# Patient Record
Sex: Male | Born: 1950 | Hispanic: No | Marital: Married | Smoking: Never smoker
Health system: Southern US, Community
[De-identification: ages and names within clinical notes are randomized; demographics above are authoritative.]

## PROBLEM LIST (undated history)

## (undated) DIAGNOSIS — M1710 Unilateral primary osteoarthritis, unspecified knee: Secondary | ICD-10-CM

## (undated) DIAGNOSIS — M179 Osteoarthritis of knee, unspecified: Secondary | ICD-10-CM

## (undated) DIAGNOSIS — I639 Cerebral infarction, unspecified: Secondary | ICD-10-CM

## (undated) DIAGNOSIS — S4991XA Unspecified injury of right shoulder and upper arm, initial encounter: Secondary | ICD-10-CM

## (undated) DIAGNOSIS — I10 Essential (primary) hypertension: Secondary | ICD-10-CM

## (undated) DIAGNOSIS — M7989 Other specified soft tissue disorders: Secondary | ICD-10-CM

## (undated) HISTORY — DX: Cerebral infarction, unspecified: I63.9

## (undated) HISTORY — PX: TONSILLECTOMY: SUR1361

---

## 2013-12-04 DIAGNOSIS — S4991XA Unspecified injury of right shoulder and upper arm, initial encounter: Secondary | ICD-10-CM

## 2013-12-04 HISTORY — DX: Unspecified injury of right shoulder and upper arm, initial encounter: S49.91XA

## 2016-08-30 DIAGNOSIS — Z1389 Encounter for screening for other disorder: Secondary | ICD-10-CM | POA: Diagnosis not present

## 2016-08-30 DIAGNOSIS — Z1211 Encounter for screening for malignant neoplasm of colon: Secondary | ICD-10-CM | POA: Diagnosis not present

## 2016-08-30 DIAGNOSIS — Z0000 Encounter for general adult medical examination without abnormal findings: Secondary | ICD-10-CM | POA: Diagnosis not present

## 2016-08-30 DIAGNOSIS — M170 Bilateral primary osteoarthritis of knee: Secondary | ICD-10-CM | POA: Diagnosis not present

## 2016-08-30 DIAGNOSIS — Z9181 History of falling: Secondary | ICD-10-CM | POA: Diagnosis not present

## 2016-08-30 DIAGNOSIS — I10 Essential (primary) hypertension: Secondary | ICD-10-CM | POA: Diagnosis not present

## 2016-08-30 DIAGNOSIS — Z125 Encounter for screening for malignant neoplasm of prostate: Secondary | ICD-10-CM | POA: Diagnosis not present

## 2016-08-30 DIAGNOSIS — Z23 Encounter for immunization: Secondary | ICD-10-CM | POA: Diagnosis not present

## 2016-09-20 DIAGNOSIS — Z1211 Encounter for screening for malignant neoplasm of colon: Secondary | ICD-10-CM | POA: Diagnosis not present

## 2016-09-20 DIAGNOSIS — K5730 Diverticulosis of large intestine without perforation or abscess without bleeding: Secondary | ICD-10-CM | POA: Diagnosis not present

## 2016-09-28 DIAGNOSIS — G8929 Other chronic pain: Secondary | ICD-10-CM | POA: Diagnosis not present

## 2016-09-28 DIAGNOSIS — M769 Unspecified enthesopathy, lower limb, excluding foot: Secondary | ICD-10-CM | POA: Diagnosis not present

## 2016-09-28 DIAGNOSIS — M25561 Pain in right knee: Secondary | ICD-10-CM | POA: Diagnosis not present

## 2016-09-28 DIAGNOSIS — M170 Bilateral primary osteoarthritis of knee: Secondary | ICD-10-CM | POA: Diagnosis not present

## 2016-09-28 DIAGNOSIS — M25562 Pain in left knee: Secondary | ICD-10-CM | POA: Diagnosis not present

## 2016-10-05 DIAGNOSIS — I10 Essential (primary) hypertension: Secondary | ICD-10-CM | POA: Diagnosis not present

## 2016-10-05 DIAGNOSIS — R7301 Impaired fasting glucose: Secondary | ICD-10-CM | POA: Diagnosis not present

## 2016-11-14 DIAGNOSIS — R0602 Shortness of breath: Secondary | ICD-10-CM | POA: Diagnosis not present

## 2016-11-14 DIAGNOSIS — Z6838 Body mass index (BMI) 38.0-38.9, adult: Secondary | ICD-10-CM | POA: Diagnosis not present

## 2016-11-14 DIAGNOSIS — G464 Cerebellar stroke syndrome: Secondary | ICD-10-CM | POA: Diagnosis not present

## 2016-11-14 DIAGNOSIS — R471 Dysarthria and anarthria: Secondary | ICD-10-CM | POA: Diagnosis not present

## 2016-11-14 DIAGNOSIS — R7303 Prediabetes: Secondary | ICD-10-CM | POA: Diagnosis not present

## 2016-11-14 DIAGNOSIS — Z79899 Other long term (current) drug therapy: Secondary | ICD-10-CM | POA: Diagnosis not present

## 2016-11-14 DIAGNOSIS — R9431 Abnormal electrocardiogram [ECG] [EKG]: Secondary | ICD-10-CM | POA: Diagnosis not present

## 2016-11-14 DIAGNOSIS — I6789 Other cerebrovascular disease: Secondary | ICD-10-CM | POA: Diagnosis not present

## 2016-11-14 DIAGNOSIS — R29705 NIHSS score 5: Secondary | ICD-10-CM | POA: Diagnosis not present

## 2016-11-14 DIAGNOSIS — G8191 Hemiplegia, unspecified affecting right dominant side: Secondary | ICD-10-CM | POA: Diagnosis not present

## 2016-11-14 DIAGNOSIS — G4730 Sleep apnea, unspecified: Secondary | ICD-10-CM | POA: Diagnosis not present

## 2016-11-14 DIAGNOSIS — I161 Hypertensive emergency: Secondary | ICD-10-CM | POA: Diagnosis not present

## 2016-11-14 DIAGNOSIS — R29810 Facial weakness: Secondary | ICD-10-CM | POA: Diagnosis not present

## 2016-11-14 DIAGNOSIS — R531 Weakness: Secondary | ICD-10-CM | POA: Diagnosis not present

## 2016-11-14 DIAGNOSIS — R000 Tachycardia, unspecified: Secondary | ICD-10-CM | POA: Diagnosis not present

## 2016-11-14 DIAGNOSIS — I10 Essential (primary) hypertension: Secondary | ICD-10-CM | POA: Diagnosis not present

## 2016-11-14 DIAGNOSIS — I517 Cardiomegaly: Secondary | ICD-10-CM | POA: Diagnosis not present

## 2016-11-14 DIAGNOSIS — I619 Nontraumatic intracerebral hemorrhage, unspecified: Secondary | ICD-10-CM | POA: Diagnosis not present

## 2016-11-14 DIAGNOSIS — R4781 Slurred speech: Secondary | ICD-10-CM | POA: Diagnosis not present

## 2016-11-14 DIAGNOSIS — I629 Nontraumatic intracranial hemorrhage, unspecified: Secondary | ICD-10-CM | POA: Diagnosis not present

## 2016-11-14 DIAGNOSIS — Z87891 Personal history of nicotine dependence: Secondary | ICD-10-CM | POA: Diagnosis not present

## 2016-11-15 ENCOUNTER — Encounter: Payer: Self-pay | Admitting: *Deleted

## 2016-11-15 DIAGNOSIS — I619 Nontraumatic intracerebral hemorrhage, unspecified: Secondary | ICD-10-CM | POA: Diagnosis not present

## 2016-11-15 NOTE — PMR Pre-admission (Shared)
[facility] PMR Admission Coordinator Pre-Admission Assessment  Patient: [name] is an 65 y.o., male MRN: [id number] DOB: 01/06/1951 Height: 5\' 7"  (170.2 cm) Weight: 104.3 kg (230 lb)  Insurance Information HMO:     PPO: yes     PCP:      IPA:      80/20:      OTHER: Medicare Advantage plan PRIMARY: Health Team Advantage      Policy#: [id number]      Subscriber: pt CM Name: [name]      Phone#: [phone]     Fax#: [phone] Pre-Cert#: [id number] approved 11/17/16 through 11/23/2016 when updates can be seen by RN CM at [employer] through [employer]     Employer: retired Benefits:  Phone #: [phone]     Name: 11/15/2016 Eff. Date: 06/03/2016     Deduct: none      Out of Pocket Max: $3400      Life Max: none CIR: $225 co pay per day days 1-6 then covers 100%      SNF: no co pay days 1-20: $150 co pay days 21-100 Outpatient: $15 co pay per visit     Co-Pay: per medical necessity Home Health: $25 co pay per visit      Co-Pay:  Per medical necessity DME: 80%     Co-Pay: 20% Providers: in network  SECONDARY: none       BCBS termed  Medicaid Application Date:       Case Manager:  Disability Application Date:       Case Worker:   Emergency Conservator, museum/galleryContact Information Contact Information    Name Relation Home Work Mobile   AxtellBrueilly,[name]   [phone]      Current Medical History  Patient Admitting Diagnosis:  Left basal ganglia hemorrhage  History of Present Illness: 65 year old male  Who until recently had not seen a physician in 10 years. In past two months had seen MD and was diagnosed with essential hypertension and was prescribed meds. Over the past 2-3 days pta had some mild nausea , upset stomach and some non specific right shoulder pain. Subsequent to this, had stopped taking his HTN meds. He awoke on 11/14/16 and went to work. Noted some weakness in his RUE and some difficulty speaking. EMS was contacted.   IN the ED CT scan shows intracranial hemorrhage  in the thalamic region. Neurology consulted and recommended no anticoagulation . Recommended BP of less than 140 and close observation. Patient treated with nicardipine drip in ICU. Eventually transitioned to po BP meds including his home dose of losartan and HCTZ. Also began coreg and will need continued monitoring to keep BP less than 140 systolic.   [facility] has been reviewed by the rehabilitation admission coordinator and physician.  NIH Stroke scale: 5 Glascow Coma Scale:  Past Medical History  tonsillectomy  Family History   family history is not on file.  Prior Rehab/Hospitalizations Has the patient had major surgery during 100 days prior to admission? No   Current Medications  home medications:  Glucosamine chondroitin caplet Hydrochlorothiazide Losartan potassium  Current medications in hospital: Coreg Losartan Potassium Hydrochlorothiazide Glucosamine Chondroitin  Patients Current Diet:  Regular diet with thin liquids  Precautions / Restrictions Precautions Precautions: Fall Restrictions Weight Bearing Restrictions: No   Has the patient had 2 or more falls or a fall with injury in the past year?No  Prior Activity Level Community (5-7x/wk): working and independent pta. Owns local auto  repair; workaholic  Prior Functional Level Self Care: Did the patient need help bathing, dressing, using the toilet or eating?  Independent  Indoor Mobility: Did the patient need assistance with walking from room to room (with or without device)? Independent  Stairs: Did the patient need assistance with internal or external stairs (with or without device)? Independent  Functional Cognition: Did the patient need help planning regular tasks such as shopping or remembering to take medications? Independent  Home Assistive Devices / Equipment Home Assistive Devices/Equipment: None  Prior Device Use: Indicate devices/aids used by the patient  prior to current illness, exacerbation or injury? None of the above   Prior Functional Level Current Functional Level  Bed Mobility  Independent  Min assist   Transfers  Independent  Min assist   Mobility - Walk/Wheelchair  Independent  Mod assist (of 2 ambulated 4 feet). Needs assist to keep right hand on walker. Poor balance, leaned right and ataxic. Difficulty coordinating steps.decreased proprioception right side. 12/14 update used hemiwaker 20 feet x 2 for weight shift , sequencing and foot clearance/heel strike on right. Needs chair to follow and assist of 2 for safety. Poor control of RLE, does better when he can see foot.   Upper Body Dressing  Independent  Mod assist. Reports he has had shoulder pain for two weeks pta that limited his shoulder ROM.   Lower Body Dressing  Independent  Mod assist   Grooming  Independent  Min assist   Eating/Drinking  Independent  Min assist   Toilet Transfer  Independent  Min assist   Bladder Continence   continent  continent   Bowel Management  continent  continent   Stair Climbing    independent Other (not attempted)   Communication  intact  intact, exhibits dysarthria  and aphasia btu communicates basic needs   Memory  intact  intact per wife   Cooking/Meal Prep  independent      Housework  independent    Money Management  independent    Driving    independent     Special needs/care consideration BiPAP/CPAP  N/a CPM  N/a Continuous Drip IV  N/a Dialysis  N/a Life Vest  N/a Oxygen  N/a Special Bed  N/a Trach Size  N/a Wound Vac (area)  N/a Skin intact Bowel mgmt: continent Bladder mgmt: continent Diabetic mgmt n/a  Previous Home Environment Living Arrangements: [name]/significant other  Lives With: [name], Family (48o yo nephew lives with couple) Available Help at Discharge: Family, Friend(s), Available 24 hours/day Type of Home: House Home Layout: One level Home Access: Stairs to  enter Entrance Stairs-Rails: None Secretary/administrator of Steps:  1step Bathroom Shower/Tub: Health visitor: Standard Bathroom Accessibility: Yes How Accessible: Accessible via walker Home Care Services: No  Discharge Living Setting Plans for Discharge Living Setting: Patient's home, Lives with (comment) Type of Home at Discharge: House Discharge Home Layout: One level Discharge Home Access: Stairs to enter Entrance Stairs-Rails: None Entrance Stairs-Number of Steps: 1 step Discharge Bathroom Shower/Tub: Walk-in shower Discharge Bathroom Toilet: Standard Discharge Bathroom Accessibility: Yes How Accessible: Accessible via walker Does the patient have any problems obtaining your medications?: No  Social/Family/Support Systems Patient Roles: [name] (owns local auto shop) Contact Information: [name], wife Anticipated Caregiver: wife and family Anticipated Caregiver's Contact Information: cell [phone] Ability/Limitations of Caregiver: no limitations Caregiver Availability: 24/7 Discharge Plan Discussed with Primary Caregiver: Yes Is Caregiver In Agreement with Plan?: Yes Does Caregiver/Family have Issues with Lodging/Transportation while Pt is in Rehab?: No  Goals/Additional Needs Patient/Family Goal for Rehab: Mod I to supervision with PT, OT, and SLP Expected length of stay: ELOS 10- 12 days Cultural Considerations: Jehovahs witness Pt/Family Agrees to Admission and willing to participate: Yes Program Orientation Provided & Reviewed with Pt/Caregiver Including Roles  & Responsibilities: Yes  Patient Condition: I have reviewed the [facility], spoke with Case Manager and spoke with patient's wife by phone. Patient will benefit from ongoing PT< OT< and SLP and will benefit from the coordinated team approach provided by an inpatient acute rehabilitation admission with MD and Nursing . He will receive 3 hours per day of therapy. He is  currently min to mod assist overall functionally. We will admit today.  Preadmission Screen Completed By:  [name], [name], 11/15/2016 5:49 PM ______________________________________________________________________   Discussed status with Dr. Riley KillSwartz  on 11/17/2016  at  [phone] and received telephone approval for admission today.  Admission Coordinator:  [name], [name], time [id number] Date  11/17/2016   Assessment/Plan: Diagnosis: 1. Does the need for close, 24 hr/day  Medical supervision in concert with the patient's rehab needs make it unreasonable for this patient to be served in a less intensive setting? {yes_no_potentially:3041433} 2. Co-Morbidities requiring supervision/potential complications: *** 3. Due to {due XL:2440102}to:3041434}, does the patient require 24 hr/day rehab nursing? {yes_no_potentially:3041433} 4. Does the patient require coordinated care of a physician, rehab nurse, {coordinated VOZD:6644034}care:3041435} to address physical and functional deficits in the context of the above medical diagnosis(es)? {yes_no_potentially:3041433} Addressing deficits in the following areas: {deficits:3041436} 5. Can the patient actively participate in an intensive therapy program of at least 3 hrs of therapy 5 days a week? {yes_no_potentially:3041433} 6. The potential for patient to make measurable gains while on inpatient rehab is {potential:3041437} 7. Anticipated functional outcomes upon discharge from inpatients are: {functional outcomes:304600100} PT, {functional outcomes:304600100} OT, {functional outcomes:304600100} SLP 8. Estimated rehab length of stay to reach the above functional goals is: *** 9. Does the patient have adequate social supports to accommodate these discharge functional goals? {yes_no_potentially:3041433} 10. Anticipated D/C setting: {anticipated dc setting:21604} 11. Anticipated post D/C treatments: {post dc treatment:21605} 12. Overall Rehab/Functional Prognosis:  {potential:3041437}    RECOMMENDATIONS: This patient's condition is appropriate for continued rehabilitative care in the following setting: {appropriate setting:21606} Patient has agreed to participate in recommended program. {yes_no_potentially:3041433} Note that insurance prior authorization may be required for reimbursement for recommended care.  Comment:  [name], [name] 11/15/2016

## 2016-11-16 DIAGNOSIS — I629 Nontraumatic intracranial hemorrhage, unspecified: Secondary | ICD-10-CM | POA: Diagnosis not present

## 2016-11-16 DIAGNOSIS — I161 Hypertensive emergency: Secondary | ICD-10-CM | POA: Diagnosis not present

## 2016-11-17 ENCOUNTER — Encounter (HOSPITAL_COMMUNITY): Payer: Self-pay | Admitting: Physical Medicine and Rehabilitation

## 2016-11-17 ENCOUNTER — Inpatient Hospital Stay (HOSPITAL_COMMUNITY): Payer: PPO

## 2016-11-17 ENCOUNTER — Inpatient Hospital Stay (HOSPITAL_COMMUNITY)
Admission: RE | Admit: 2016-11-17 | Discharge: 2016-12-07 | DRG: 057 | Disposition: A | Discharge status: Home / Self Care | Payer: PPO | Source: Intra-hospital | Attending: Physical Medicine & Rehabilitation | Admitting: Physical Medicine & Rehabilitation

## 2016-11-17 DIAGNOSIS — Z6837 Body mass index (BMI) 37.0-37.9, adult: Secondary | ICD-10-CM

## 2016-11-17 DIAGNOSIS — I610 Nontraumatic intracerebral hemorrhage in hemisphere, subcortical: Secondary | ICD-10-CM

## 2016-11-17 DIAGNOSIS — R413 Other amnesia: Secondary | ICD-10-CM | POA: Diagnosis not present

## 2016-11-17 DIAGNOSIS — G8929 Other chronic pain: Secondary | ICD-10-CM | POA: Diagnosis not present

## 2016-11-17 DIAGNOSIS — M25511 Pain in right shoulder: Secondary | ICD-10-CM | POA: Diagnosis not present

## 2016-11-17 DIAGNOSIS — R7303 Prediabetes: Secondary | ICD-10-CM | POA: Diagnosis not present

## 2016-11-17 DIAGNOSIS — F329 Major depressive disorder, single episode, unspecified: Secondary | ICD-10-CM | POA: Diagnosis present

## 2016-11-17 DIAGNOSIS — G8191 Hemiplegia, unspecified affecting right dominant side: Secondary | ICD-10-CM | POA: Diagnosis not present

## 2016-11-17 DIAGNOSIS — I619 Nontraumatic intracerebral hemorrhage, unspecified: Secondary | ICD-10-CM | POA: Diagnosis not present

## 2016-11-17 DIAGNOSIS — I629 Nontraumatic intracranial hemorrhage, unspecified: Secondary | ICD-10-CM | POA: Diagnosis not present

## 2016-11-17 DIAGNOSIS — R29810 Facial weakness: Secondary | ICD-10-CM | POA: Diagnosis not present

## 2016-11-17 DIAGNOSIS — E871 Hypo-osmolality and hyponatremia: Secondary | ICD-10-CM

## 2016-11-17 DIAGNOSIS — I119 Hypertensive heart disease without heart failure: Secondary | ICD-10-CM | POA: Diagnosis present

## 2016-11-17 DIAGNOSIS — I10 Essential (primary) hypertension: Secondary | ICD-10-CM | POA: Diagnosis not present

## 2016-11-17 DIAGNOSIS — Z833 Family history of diabetes mellitus: Secondary | ICD-10-CM

## 2016-11-17 DIAGNOSIS — M170 Bilateral primary osteoarthritis of knee: Secondary | ICD-10-CM | POA: Diagnosis not present

## 2016-11-17 DIAGNOSIS — I69993 Ataxia following unspecified cerebrovascular disease: Secondary | ICD-10-CM | POA: Diagnosis not present

## 2016-11-17 DIAGNOSIS — F4322 Adjustment disorder with anxiety: Secondary | ICD-10-CM | POA: Diagnosis present

## 2016-11-17 DIAGNOSIS — Z7401 Bed confinement status: Secondary | ICD-10-CM | POA: Diagnosis not present

## 2016-11-17 DIAGNOSIS — R1311 Dysphagia, oral phase: Secondary | ICD-10-CM | POA: Diagnosis not present

## 2016-11-17 DIAGNOSIS — I169 Hypertensive crisis, unspecified: Secondary | ICD-10-CM | POA: Diagnosis not present

## 2016-11-17 DIAGNOSIS — R208 Other disturbances of skin sensation: Secondary | ICD-10-CM | POA: Diagnosis present

## 2016-11-17 DIAGNOSIS — R079 Chest pain, unspecified: Secondary | ICD-10-CM | POA: Diagnosis not present

## 2016-11-17 DIAGNOSIS — R209 Unspecified disturbances of skin sensation: Secondary | ICD-10-CM

## 2016-11-17 DIAGNOSIS — I69198 Other sequelae of nontraumatic intracerebral hemorrhage: Principal | ICD-10-CM

## 2016-11-17 DIAGNOSIS — Z79899 Other long term (current) drug therapy: Secondary | ICD-10-CM | POA: Diagnosis not present

## 2016-11-17 DIAGNOSIS — R471 Dysarthria and anarthria: Secondary | ICD-10-CM | POA: Diagnosis not present

## 2016-11-17 DIAGNOSIS — I161 Hypertensive emergency: Secondary | ICD-10-CM | POA: Diagnosis not present

## 2016-11-17 DIAGNOSIS — M2550 Pain in unspecified joint: Secondary | ICD-10-CM | POA: Diagnosis not present

## 2016-11-17 DIAGNOSIS — M1711 Unilateral primary osteoarthritis, right knee: Secondary | ICD-10-CM

## 2016-11-17 DIAGNOSIS — E669 Obesity, unspecified: Secondary | ICD-10-CM | POA: Diagnosis not present

## 2016-11-17 DIAGNOSIS — E119 Type 2 diabetes mellitus without complications: Secondary | ICD-10-CM | POA: Diagnosis not present

## 2016-11-17 DIAGNOSIS — I6789 Other cerebrovascular disease: Secondary | ICD-10-CM | POA: Diagnosis not present

## 2016-11-17 DIAGNOSIS — I69398 Other sequelae of cerebral infarction: Secondary | ICD-10-CM

## 2016-11-17 HISTORY — DX: Unspecified injury of right shoulder and upper arm, initial encounter: S49.91XA

## 2016-11-17 HISTORY — DX: Other specified soft tissue disorders: M79.89

## 2016-11-17 HISTORY — DX: Essential (primary) hypertension: I10

## 2016-11-17 HISTORY — DX: Unilateral primary osteoarthritis, unspecified knee: M17.10

## 2016-11-17 HISTORY — DX: Osteoarthritis of knee, unspecified: M17.9

## 2016-11-17 LAB — GLUCOSE, CAPILLARY
Glucose-Capillary: 89 mg/dL (ref 65–99)
Glucose-Capillary: 173 mg/dL — ABNORMAL HIGH (ref 65–99)

## 2016-11-17 MED ORDER — DIPHENHYDRAMINE HCL 12.5 MG/5ML PO ELIX: 12.5000 mg | ORAL_SOLUTION | Freq: Four times a day (QID) | ORAL | Status: DC | PRN

## 2016-11-17 MED ORDER — PROCHLORPERAZINE 25 MG RE SUPP: 12.5000 mg | Freq: Four times a day (QID) | RECTAL | Status: DC | PRN

## 2016-11-17 MED ORDER — PROCHLORPERAZINE EDISYLATE 5 MG/ML IJ SOLN: 5.0000 mg | Freq: Four times a day (QID) | INTRAMUSCULAR | Status: DC | PRN

## 2016-11-17 MED ORDER — IPRATROPIUM-ALBUTEROL 0.5-2.5 (3) MG/3ML IN SOLN: 3.0000 mL | Freq: Four times a day (QID) | RESPIRATORY_TRACT | Status: DC | PRN

## 2016-11-17 MED ORDER — TRAMADOL HCL 50 MG PO TABS: 50.0000 mg | ORAL_TABLET | Freq: Four times a day (QID) | ORAL | Status: DC | PRN

## 2016-11-17 MED ORDER — INSULIN ASPART 100 UNIT/ML ~~LOC~~ SOLN: 0.0000 [IU] | Freq: Every day | SUBCUTANEOUS | Status: DC

## 2016-11-17 MED ORDER — POLYETHYLENE GLYCOL 3350 17 G PO PACK
17.0000 g | PACK | Freq: Every day | ORAL | Status: DC | PRN
  Filled 2016-11-17: qty 1

## 2016-11-17 MED ORDER — HYDROCHLOROTHIAZIDE 25 MG PO TABS
25.0000 mg | ORAL_TABLET | Freq: Every day | ORAL | Status: DC
  Administered 2016-11-18 – 2016-12-07 (×20): 25 mg via ORAL
  Filled 2016-11-17 (×20): qty 1

## 2016-11-17 MED ORDER — CLONIDINE HCL 0.1 MG PO TABS: 0.1000 mg | ORAL_TABLET | Freq: Four times a day (QID) | ORAL | Status: DC | PRN

## 2016-11-17 MED ORDER — TRAZODONE HCL 50 MG PO TABS: 25.0000 mg | ORAL_TABLET | Freq: Every evening | ORAL | Status: DC | PRN

## 2016-11-17 MED ORDER — CARVEDILOL 3.125 MG PO TABS
3.1250 mg | ORAL_TABLET | Freq: Two times a day (BID) | ORAL | Status: DC
  Administered 2016-11-17 – 2016-12-07 (×38): 3.125 mg via ORAL
  Filled 2016-11-17 (×39): qty 1

## 2016-11-17 MED ORDER — DICLOFENAC SODIUM 1 % TD GEL
2.0000 g | Freq: Four times a day (QID) | TRANSDERMAL | Status: DC
  Administered 2016-11-17 – 2016-12-07 (×58): 2 g via TOPICAL
  Filled 2016-11-17 (×2): qty 100

## 2016-11-17 MED ORDER — GUAIFENESIN-DM 100-10 MG/5ML PO SYRP: 5.0000 mL | ORAL_SOLUTION | Freq: Four times a day (QID) | ORAL | Status: DC | PRN

## 2016-11-17 MED ORDER — ALUM & MAG HYDROXIDE-SIMETH 200-200-20 MG/5ML PO SUSP: 30.0000 mL | ORAL | Status: DC | PRN

## 2016-11-17 MED ORDER — FLEET ENEMA 7-19 GM/118ML RE ENEM: 1.0000 | ENEMA | Freq: Once | RECTAL | Status: DC | PRN

## 2016-11-17 MED ORDER — PROCHLORPERAZINE MALEATE 5 MG PO TABS: 5.0000 mg | ORAL_TABLET | Freq: Four times a day (QID) | ORAL | Status: DC | PRN

## 2016-11-17 MED ORDER — INSULIN ASPART 100 UNIT/ML ~~LOC~~ SOLN
0.0000 [IU] | Freq: Three times a day (TID) | SUBCUTANEOUS | Status: DC
  Administered 2016-11-18: 2 [IU] via SUBCUTANEOUS
  Administered 2016-11-18 – 2016-11-22 (×5): 1 [IU] via SUBCUTANEOUS
  Administered 2016-11-24: 2 [IU] via SUBCUTANEOUS
  Administered 2016-11-26: 1 [IU] via SUBCUTANEOUS

## 2016-11-17 MED ORDER — BISACODYL 10 MG RE SUPP: 10.0000 mg | Freq: Every day | RECTAL | Status: DC | PRN

## 2016-11-17 MED ORDER — ENOXAPARIN SODIUM 40 MG/0.4ML ~~LOC~~ SOLN
40.0000 mg | SUBCUTANEOUS | Status: DC
  Administered 2016-11-17 – 2016-12-06 (×20): 40 mg via SUBCUTANEOUS
  Filled 2016-11-17 (×20): qty 0.4

## 2016-11-17 MED ORDER — ACETAMINOPHEN 325 MG PO TABS: 325.0000 mg | ORAL_TABLET | ORAL | Status: DC | PRN

## 2016-11-17 MED ORDER — NAPHAZOLINE-GLYCERIN 0.012-0.2 % OP SOLN
2.0000 [drp] | Freq: Three times a day (TID) | OPHTHALMIC | Status: DC
  Administered 2016-11-17 – 2016-12-06 (×63): 2 [drp] via OPHTHALMIC
  Filled 2016-11-17 (×3): qty 15

## 2016-11-17 MED ORDER — LOSARTAN POTASSIUM 50 MG PO TABS
100.0000 mg | ORAL_TABLET | Freq: Every day | ORAL | Status: DC
  Administered 2016-11-18 – 2016-12-07 (×20): 100 mg via ORAL
  Filled 2016-11-17 (×21): qty 2

## 2016-11-17 NOTE — Progress Notes (Signed)
Orthopedic Tech Progress Note Patient Details:  Gay FillerStephen Laplante 08/10/1951 [id number]  Ortho Devices Type of Ortho Device: Arm sling Ortho Device/Splint Location: rue Ortho Device/Splint Interventions: Application   Nikki DomCrawford, Jayliana Valencia 11/17/2016, 4:37 PM

## 2016-11-17 NOTE — Progress Notes (Signed)
Orthopedic Tech Progress Note Patient Details:  [name] 10/17/1951 [id number] Patient already has arm sling. Patient ID: [name], male   DOB: 08/06/1951, 65 y.o.   MRN: [id number]   [name], [name] 11/17/2016, 4:33 PM

## 2016-11-17 NOTE — PMR Pre-admission (Signed)
[facility] PMR Admission Coordinator Pre-Admission Assessment  Patient: [name] is an 65 y.o., male MRN: [id number] DOB: 07/20/1951 Height: 5\' 7"  (170.2 cm) Weight: 104.3 kg (230 lb)  Insurance Information HMO:     PPO: yes     PCP:      IPA:      80/20:      OTHER: Medicare Advantage plan PRIMARY: Health Team Advantage      Policy#: [id number]      Subscriber: pt CM Name: Rachelle Hora      Phone#: [phone]     Fax#: [phone] Pre-Cert#: [id number] approved 11/17/16 through 11/23/2016 when updates can be seen by RN CM at [employer] through [employer]     Employer: retired Benefits:  Phone #: [phone]     Name: 11/15/2016 Eff. Date: 06/03/2016     Deduct: none      Out of Pocket Max: $3400      Life Max: none CIR: $225 co pay per day days 1-6 then covers 100%      SNF: no co pay days 1-20: $150 co pay days 21-100 Outpatient: $15 co pay per visit     Co-Pay: per medical necessity Home Health: $25 co pay per visit      Co-Pay:  Per medical necessity DME: 80%     Co-Pay: 20% Providers: in network  SECONDARY: none       BCBS termed  Medicaid Application Date:       Case Manager:  Disability Application Date:       Case Worker:   Emergency Actuary Information    Name Relation Home Work Mobile   Amesti Spouse   [phone]      Current Medical History  Patient Admitting Diagnosis:  Left basal ganglia hemorrhage  History of Present Illness: 65 year old male  Who until recently had not seen a physician in 10 years. In past two months had seen MD and was diagnosed with essential hypertension and was prescribed meds. Over the past 2-3 days pta had some mild nausea , upset stomach and some non specific right shoulder pain. Subsequent to this, had stopped taking his HTN meds. He awoke on 11/14/16 and went to work. Noted some weakness in his RUE and some difficulty speaking. EMS was contacted.   IN the ED CT scan shows  intracranial hemorrhage in the thalamic region. Neurology consulted and recommended no anticoagulation . Recommended BP of less than 140 and close observation. Patient treated with nicardipine drip in ICU. Eventually transitioned to po BP meds including his home dose of losartan and HCTZ. Also began coreg and will need continued monitoring to keep BP less than 140 systolic.   Patient's medical record from [facility] has been reviewed by the rehabilitation admission coordinator and physician.  NIH Stroke scale: 5 Glascow Coma Scale:  Past Medical History  tonsillectomy  Family History   family history is not on file.  Prior Rehab/Hospitalizations Has the patient had major surgery during 100 days prior to admission? No              Current Medications  home medications:  Glucosamine chondroitin caplet Hydrochlorothiazide Losartan potassium  Current medications in hospital: Coreg Losartan Potassium Hydrochlorothiazide Glucosamine Chondroitin  Patients Current Diet:  Regular diet with thin liquids  Precautions / Restrictions Precautions Precautions: Fall Restrictions Weight Bearing Restrictions: No   Has the patient had 2 or more falls or a fall with injury  in the past year?No  Prior Activity Level Community (5-7x/wk): working and independent pta. 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  Independent  Indoor Mobility: Did the patient need assistance with walking from room to room (with or without device)? Independent  Stairs: Did the patient need assistance with internal or external stairs (with or without device)? Independent  Functional Cognition: Did the patient need help planning regular tasks such as shopping or remembering to take medications? Independent  Home Assistive Devices / Equipment Home Assistive Devices/Equipment: None  Prior  Device Use: Indicate devices/aids used by the patient prior to current illness, exacerbation or injury? None of the above   Prior Functional Level Current Functional Level  Bed Mobility Independent Min assist  Transfers Independent Min assist  Mobility - Walk/Wheelchair Independent Mod assist (of 2 ambulated 4 feet). Needs assist to keep right hand on walker. Poor balance, leaned right and ataxic. Difficulty coordinating steps.decreased proprioception right side. 12/14 update used hemiwaker 20 feet x 2 for weight shift , sequencing and foot clearance/heel strike on right. Needs chair to follow and assist of 2 for safety. Poor control of RLE, does better when he can see foot.  Upper Body Dressing Independent Mod assist. Reports he has had shoulder pain for two weeks pta that limited his shoulder ROM.  Lower Body Dressing Independent Mod assist  Grooming Independent Min assist  Eating/Drinking Independent Min assist  Toilet Transfer Independent Min assist  Bladder Continence  continent continent  Bowel Management continent continent  Stair Climbing   independent Other (not attempted)  Communication intact intact, exhibits dysarthria  and aphasia btu communicates basic needs  Memory intact intact per wife  Cooking/Meal Prep independent     Housework independent   Money Management independent   Driving   independent     Special needs/care consideration BiPAP/CPAP  N/a CPM  N/a Continuous Drip IV  N/a Dialysis  N/a Life Vest  N/a Oxygen  N/a Special Bed  N/a Trach Size  N/a Wound Vac (area)  N/a Skin intact Bowel mgmt: continent Bladder mgmt: continent Diabetic mgmt n/a  Previous Home Environment Living Arrangements: Spouse/significant other  Lives With: Spouse, Family (132o yo nephew lives with couple) Available Help at Discharge: Family, Friend(s), Available 24 hours/day Type of Home: House Home Layout: One level Home Access: Stairs to enter Entrance Stairs-Rails:  None Secretary/administratorntrance Stairs-Number of Steps:  1step Bathroom Shower/Tub: Health visitorWalk-in shower Bathroom Toilet: Standard Bathroom Accessibility: Yes How Accessible: Accessible via walker Home Care Services: No  Discharge Living Setting Plans for Discharge Living Setting: Patient's home, Lives with (comment) Type of Home at Discharge: House Discharge Home Layout: One level Discharge Home Access: Stairs to enter Entrance Stairs-Rails: None Entrance Stairs-Number of Steps: 1 step Discharge Bathroom Shower/Tub: Walk-in shower Discharge Bathroom Toilet: Standard Discharge Bathroom Accessibility: Yes How Accessible: Accessible via walker Does the patient have any problems obtaining your medications?: No  Social/Family/Support Systems 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) Contact Information: Erskine SquibbJane, wife Anticipated Caregiver: wife and family Anticipated Caregiver's Contact Information: cell [phone] Ability/Limitations of Caregiver: no limitations Caregiver Availability: 24/7 Discharge Plan Discussed with Primary Caregiver: Yes Is Caregiver In Agreement with Plan?: Yes Does Caregiver/Family have Issues with Lodging/Transportation while Pt is in Rehab?: No  Goals/Additional Needs Patient/Family Goal for Rehab: Mod I to supervision with PT, OT, and SLP Expected length of stay: ELOS 10- 12 days Cultural Considerations: Rica KoyanagiJehovahs witness  Pt/Family Agrees to Admission and willing to participate: Yes Program Orientation Provided & Reviewed with Pt/Caregiver Including Roles  & Responsibilities: Yes  Patient Condition: I have [facility], spoke with Case Manager and spoke with patient's wife by phone. Patient will benefit from ongoing PT< OT< and SLP and will benefit from the coordinated team approach provided by an inpatient acute rehabilitation admission with MD and Nursing . He will receive 3 hours per day of therapy. He is currently min to mod  assist overall functionally. We will admit today.  Preadmission Screen Completed By:  Clois DupesBoyette, Barbara Godwin, 11/15/2016 5:49 PM ______________________________________________________________________   Discussed status with Dr. Riley KillSwartz  on 11/17/2016  at  [phone] and received telephone approval for admission today.  Admission Coordinator:  Clois DupesBoyette, Barbara Godwin, time [phone] Date  11/17/2016   Assessment/Plan: Diagnosis: left thalamic hemorrhage 1. Does the need for close, 24 hr/day  Medical supervision in concert with the patient's rehab needs make it unreasonable for this patient to be served in a less intensive setting? Yes 2. Co-Morbidities requiring supervision/potential complications: htn,  3. Due to bladder management, bowel management, safety, skin/wound care, disease management, medication administration, pain management and patient education, does the patient require 24 hr/day rehab nursing? Yes 4. Does the patient require coordinated care of a physician, rehab nurse, PT (1-2 5. 1-2 hrs/day, 5 days/week), OT (1-2 hrs/day, 5 days/week) and SLP (1-2 hrs/day, 5 days/week) to address physical and functional deficits in the context of the above medical diagnosis(es)? Yes Addressing deficits in the following areas: balance, endurance, locomotion, strength, transferring, bowel/bladder control, bathing, dressing, feeding, grooming, toileting, cognition, speech, language, swallowing and psychosocial support 6. Can the patient actively participate in an intensive therapy program of at least 3 hrs of therapy 5 days a week? Yes 7. The potential for patient to make measurable gains while on inpatient rehab is excellent 8. Anticipated functional outcomes upon discharge from inpatients are: modified independent PT, modified independent OT, modified independent SLP 9. Estimated rehab length of stay to reach the above functional goals is: 10-12 days 10. Does the patient have adequate social supports to  accommodate these discharge functional goals? Yes 11. Anticipated D/C setting: Home 12. Anticipated post D/C treatments: HH therapy and Outpatient therapy 13. Overall Rehab/Functional Prognosis: excellent    RECOMMENDATIONS: This patient's condition is appropriate for continued rehabilitative care in the following setting: CIR Patient has agreed to participate in recommended program. Yes Note that insurance prior authorization may be required for reimbursement for recommended care.  Comment: Admit to inpatient rehab today  Ranelle OysterZachary T. Fannie Gathright, MD, [facility] 11/17/2016   Clois DupesBoyette, Barbara Godwin 11/15/2016

## 2016-11-17 NOTE — Progress Notes (Signed)
Admit to unit . Oriented to unit , order review and review of rehab schedule. Wife noted she understood information and pt thankful for full discussion of information. Pamelia HoitSharp, Reana Chacko B

## 2016-11-17 NOTE — H&P (Signed)
Physical Medicine and Rehabilitation Admission H&P    CC: Right sided weakness with sensory deficits, right facial weakness, mild dysarthria.  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  He was started on IV nifedipine and BP medications adjusted for tighter control.  Neurology recommended BP control for management of bleed. 2 D echo with definity revealed mild concentric LVH, mild left atrial dilatation, EF 60-65% and impaired relaxation. Therapy initiated and patient limited by right side weakness with sensory changes, dysarthria and mild dysphagia. CIR recommended for follow up therapy. Chart reviewed by MD and rehab CM and patient felt to be a good candidate for intensive rehab program.    Review of Systems  HENT: Negative for hearing loss and tinnitus.   Eyes: Positive for redness. Negative for blurred vision and double vision.  Respiratory: Negative for cough and shortness of breath.   Cardiovascular: Negative for chest pain, palpitations and leg swelling.  Gastrointestinal: Negative for constipation, heartburn and nausea.       Sore mouth--has been biting inner right check due to sensory deficits  Genitourinary: Negative for dysuria and frequency.  Musculoskeletal: Positive for joint pain (bilateral knee pain/Right shoulder has flared up in the past 2 weeks. ) and myalgias.  Neurological: Positive for dizziness, sensory change, speech change, focal weakness and weakness. Negative for headaches.  Psychiatric/Behavioral: Negative for depression. The patient is nervous/anxious and has insomnia (due to anxiety).     Past Medical History:  Diagnosis Date  . HTN (hypertension)   . OA (osteoarthritis) of knee    endstage OA bilateral knees--was recently cleared for surgery  . Right leg swelling   . Right shoulder injury 2015    Past Surgical History:  Procedure Laterality Date  . TONSILLECTOMY       Family History  Problem Relation Age of Onset  . Dementia Mother   . Diabetes Father     Social History:  Married. 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 He reports that he has never smoked. He has never used smokeless tobacco. He reports that he does not drink alcohol or use drugs.    Allergies: No Known Allergies    Medications Prior to Admission  Medication Sig Dispense Refill  . glucosamine-chondroitin 500-400 MG tablet Take 1 tablet by mouth daily.    . hydrochlorothiazide (HYDRODIURIL) 25 MG tablet Take 25 mg by mouth daily.    Marland Kitchen. losartan (COZAAR) 100 MG tablet Take 100 mg by mouth daily.      Home: One level home with 1 STE   Functional History: Independent without AD  Functional Status:  Mobility: Min assist transfer    ADL:   Cognition:     Blood pressure 134/67, pulse 70, temperature 98.1 F (36.7 C), temperature source Oral, resp. rate 20, SpO2 97 %. Physical Exam  Nursing note and vitals reviewed. Constitutional: He is oriented to person, place, and time. He appears well-developed and well-nourished.  HENT:  Head: Normocephalic and atraumatic.  Mouth/Throat: Oropharynx is clear and moist.  Eyes: EOM are normal. Pupils are equal, round, and reactive to light. Right eye exhibits no discharge. Left eye exhibits no discharge. Right conjunctiva is injected.  Left conjunctiva is injected.  Neck: Normal range of motion. Neck supple.  Cardiovascular: Normal rate and regular rhythm.   Respiratory: Effort normal and breath sounds normal. No stridor. No respiratory distress. He has no wheezes.  GI: Soft. Bowel sounds are normal. He exhibits no distension. There is no tenderness.  Musculoskeletal: He exhibits no edema.    Pain right shoulder with external rotation.   Neurological: He is alert and oriented to person, place, and time. A cranial nerve deficit is present.  Right facial weakness with mild to moderate dysarthria depending of rate of speech. Able to follow basic one and two step commands without difficulty. Right hemi-body sensory deficits. RUE 3 to 3+ proximal to distal. RLE: 3+ to 4-/5. Cognitively appropriate.   Skin: Skin is warm and dry.  Bilateral knees with healed abrasions. Stasis changes bilateral shins.   Psychiatric: He has a normal mood and affect. His behavior is normal. Judgment and thought content normal.      Recent Labs: Lipid panel: Chol- 125    HDL: 37   LDL: 72.4   Trig: 78 Na: 137    K: 4.0   CL:101    BUN:19   Cr: 0.9      Hgb: 15.0       HCT:43.8       WBC: 8.1     PLT: 265 Hgb A1c: 6.3  TSH:1.3 AST:34   ALT: 58    A phos:  49   Medical Problem List and Plan: 1.  Functional and mobility deficits secondary to left thalamic hemorrhage  -admit to inpatient rehab 2.  DVT Prophylaxis/Anticoagulation: Pharmaceutical: Lovenox 3. Pain Management:  Will add tramadol prn for knee pain/shoulder pain. Monitor for symptoms as activity levels improve.  4. Mood: Team to provide ego support. Under lot of stress due to business commitments/end of year. LCSW to follow for evaluation and support.  5. Neuropsych: This patient is capable of making decisions on his own behalf. 6. Skin/Wound Care: Routine pressure relief measures. Maintain adequate nutritional and hydration status.  7. Fluids/Electrolytes/Nutrition: Monitor I/O. Check lytes in am. 8. HTN: Monitor BP bid--on HCTZ and cozaar. Monitor renal status.  9. 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 Add  Voltaren gel qid. 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   -consider supportive/OA knee brace for support  11. Prediabetes: Hgb A1c- 6.3. Will add CM restrictions. Consult RD  to educate patient on diet.  12. Neuro work up: will order MRI. Recent ECHO results above  -consider formal neuro consult      Post Admission Physician Evaluation: 1. Functional deficits secondary  to left thalamic hemorrhage. 2. Patient is admitted to receive collaborative, interdisciplinary care between the physiatrist, rehab nursing staff, and therapy team. 3. Patient's level of medical complexity and substantial therapy needs in context of that medical necessity cannot be provided at a lesser intensity of care such as a SNF. 4. Patient has experienced substantial functional loss from his/her baseline which was documented above under the "Functional History" and "Functional Status" headings.  Judging by the patient's diagnosis, physical exam, and functional history, the patient has potential for functional progress which will result in measurable gains while on inpatient rehab.  These gains will be of substantial and practical use upon discharge  in facilitating mobility and self-care at the household level. 5. Physiatrist will provide 24 hour management of medical needs as well as oversight of the therapy plan/treatment and provide guidance  as appropriate regarding the interaction of the two. 6. The Preadmission Screening has been reviewed and patient status is unchanged unless otherwise stated above. 7. 24 hour rehab nursing will assist with bladder management, bowel management, safety, skin/wound care, disease management, medication administration, pain management and patient education  and help integrate therapy concepts, techniques,education, etc. 8. PT will assess and treat for/with: Lower extremity strength, range of motion, stamina, balance, functional mobility, safety, adaptive techniques and equipment, NMR, pain control, assessment for stabilizing knee brace.   Goals are: mod I. 9. OT will assess and treat for/with: ADL's, functional mobility, safety, upper extremity strength, adaptive  techniques and equipment, NMR, shoulder ROM, family ed.   Goals are: mod I to supervision. Therapy may proceed with showering this patient. 10. SLP will assess and treat for/with: speech, swallowing.  Goals are: mod I. 11. Case Management and Social Worker will assess and treat for psychological issues and discharge planning. 12. Team conference will be held weekly to assess progress toward goals and to determine barriers to discharge. 13. Patient will receive at least 3 hours of therapy per day at least 5 days per week. 14. ELOS: 8-12 days       15. Prognosis:  excellent     Ranelle Oyster, MD, [facility] Physical Medicine & Rehabilitation 11/17/2016  Jacquelynn Cree, Cordelia Poche 11/17/2016

## 2016-11-18 ENCOUNTER — Inpatient Hospital Stay (HOSPITAL_COMMUNITY): Payer: PPO | Admitting: Speech Pathology

## 2016-11-18 ENCOUNTER — Inpatient Hospital Stay (HOSPITAL_COMMUNITY): Payer: PPO | Admitting: Occupational Therapy

## 2016-11-18 ENCOUNTER — Inpatient Hospital Stay (HOSPITAL_COMMUNITY): Payer: PPO | Admitting: Physical Therapy

## 2016-11-18 LAB — GLUCOSE, CAPILLARY
GLUCOSE-CAPILLARY: 161 mg/dL — AB (ref 65–99)
GLUCOSE-CAPILLARY: 94 mg/dL (ref 65–99)
Glucose-Capillary: 126 mg/dL — ABNORMAL HIGH (ref 65–99)
Glucose-Capillary: 122 mg/dL — ABNORMAL HIGH (ref 65–99)

## 2016-11-18 LAB — CBC WITH DIFFERENTIAL/PLATELET
BASOS ABS: 0.1 10*3/uL (ref 0.0–0.1)
BASOS PCT: 1 %
Eosinophils Absolute: 0.1 10*3/uL (ref 0.0–0.7)
Eosinophils Relative: 1 %
HEMATOCRIT: 44 % (ref 39.0–52.0)
HEMOGLOBIN: 14.7 g/dL (ref 13.0–17.0)
Lymphocytes Relative: 24 %
Lymphs Abs: 2.2 10*3/uL (ref 0.7–4.0)
MCH: 30.1 pg (ref 26.0–34.0)
MCHC: 33.4 g/dL (ref 30.0–36.0)
MCV: 90 fL (ref 78.0–100.0)
MONO ABS: 1 10*3/uL (ref 0.1–1.0)
Monocytes Relative: 11 %
NEUTROS ABS: 5.8 10*3/uL (ref 1.7–7.7)
NEUTROS PCT: 63 %
Platelets: 284 10*3/uL (ref 150–400)
RBC: 4.89 MIL/uL (ref 4.22–5.81)
RDW: 12.6 % (ref 11.5–15.5)
WBC: 9.2 10*3/uL (ref 4.0–10.5)

## 2016-11-18 LAB — COMPREHENSIVE METABOLIC PANEL
ALBUMIN: 3.4 g/dL — AB (ref 3.5–5.0)
ALK PHOS: 44 U/L (ref 38–126)
ALT: 34 U/L (ref 17–63)
AST: 25 U/L (ref 15–41)
Anion gap: 12 (ref 5–15)
BILIRUBIN TOTAL: 0.8 mg/dL (ref 0.3–1.2)
BUN: 20 mg/dL (ref 6–20)
CALCIUM: 9.1 mg/dL (ref 8.9–10.3)
CO2: 22 mmol/L (ref 22–32)
Chloride: 101 mmol/L (ref 101–111)
Creatinine, Ser: 0.89 mg/dL (ref 0.61–1.24)
Glucose, Bld: 95 mg/dL (ref 65–99)
Potassium: 4 mmol/L (ref 3.5–5.1)
SODIUM: 135 mmol/L (ref 135–145)
TOTAL PROTEIN: 6.5 g/dL (ref 6.5–8.1)

## 2016-11-18 NOTE — Evaluation (Signed)
Occupational Therapy Assessment and Plan  Patient Details  Name: [name] MRN: [id number] Date of Birth: August 11, 1951  OT Diagnosis: abnormal posture, ataxia, hemiplegia affecting dominant side and muscle weakness (generalized) Rehab Potential: Rehab Potential (ACUTE ONLY): Excellent ELOS: 2-2.5 weeks   Today's Date: 11/18/2016 OT Individual Time:  - 0086-7619 Individual Treatment Time Calculation: 81 min     Problem List: Patient Active Problem List   Diagnosis Date Noted  . ICH (intracerebral hemorrhage) ([place]) 11/17/2016    Past Medical History:  Past Medical History:  Diagnosis Date  . HTN (hypertension)   . OA (osteoarthritis) of knee    endstage OA bilateral knees--was recently cleared for surgery  . Right leg swelling   . Right shoulder injury 2015   Past Surgical History:  Past Surgical History:  Procedure Laterality Date  . 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  He was started on IV nifedipine and BP medications adjusted for tighter control.  Neurology recommended BP control for management of bleed. 2 D echo with definity revealed mild concentric LVH, mild left atrial dilatation, EF 60-65% and impaired relaxation. Therapy initiated and patient limited by right side weakness with sensory changes, dysarthria and mild dysphagia. CIR recommended for follow up therapy. Chart reviewed by MD and rehab CM and patient felt to be a good candidate for intensive rehab program.   Patient currently requires max with basic self-care skills secondary to muscle weakness, decreased cardiorespiratoy endurance, unbalanced muscle activation, ataxia and  decreased coordination, decreased attention to right and decreased postural control and hemiplegia.  Prior to hospitalization, patient could complete BADLs with independent .  Patient will benefit from skilled intervention to decrease level of assist with basic self-care skills prior to discharge home with care partner.  Anticipate patient will require intermittent supervision and follow up home health and follow up outpatient.  OT - End of Session Endurance Deficit: Yes OT Assessment Rehab Potential (ACUTE ONLY): Excellent Barriers to Discharge: Other (comment) (N/A) OT Patient demonstrates impairments in the following area(s): Balance;Safety;Sensory;Endurance;Cognition;Motor;Perception OT Basic ADL's Functional Problem(s): Grooming;Bathing;Dressing;Toileting OT Advanced ADL's Functional Problem(s): Simple Meal Preparation OT Transfers Functional Problem(s): Toilet;Tub/Shower OT Additional Impairment(s): Fuctional Use of Upper Extremity OT Plan OT Intensity: Minimum of 1-2 x/day, 45 to 90 minutes OT Frequency: 5 out of 7 days OT Duration/Estimated Length of Stay: 2-2.5 weeks OT Treatment/Interventions: Balance/vestibular training;Discharge planning;Self Care/advanced ADL retraining;Therapeutic Activities;UE/LE Coordination activities;Functional mobility training;Patient/family education;Therapeutic Exercise;Visual/perceptual remediation/compensation;Neuromuscular re-education;Psychosocial support;UE/LE Strength taining/ROM OT Self Feeding Anticipated Outcome(s): N/A OT Basic Self-Care Anticipated Outcome(s): Mod I  OT Toileting Anticipated Outcome(s): Mod I  OT Bathroom Transfers Anticipated Outcome(s): Supervision-Mod I  OT Recommendation Patient destination: Home Follow Up Recommendations: Home health OT;Outpatient OT Equipment Recommended: Tub/shower seat;3 in 1 bedside comode   Skilled Therapeutic Intervention Skilled OT session completed with focus on initial evaluation, education  on role of OT/POC, and establishment of client-centered goals. Pt was seated in recliner at time of arrival, receptive to tx. Stand pivot transfer from recliner to w/c completed with Mod A and right knee blocked due to decreased proprioception/sensation. Pt completed toilet transfer in manner as written above,  but with grab bars to raised toilet seat. Total A for clothing mgt and hygiene due to bilateral demands of task. Afterwards pt completed stand pivot to drop arm commode in shower. IV covered. Bathing completed with overall Mod A and instruction on integrating R UE and safety with hand placement during sit<stand transitions. Significant coordination/sensation/proprioception deficits noted in R UE (hands as well as upper arm). Pt favored L UE during most of bathing tasks. Dressing was then completed w/c level at sink. Total A for LB dressing due to pt inability to utilize ankle over knee strategies and significant R hemiparesis. Right knee brace donned. Oral care and shaving (with L UE) completed w/c level at sink with close supervision. Education initiated on using R UE as stabilizer during grooming tasks. At end of session pt completed transfer back  to recliner and was left with R UE elevated with arm sling, and all needs within reach.   OT Evaluation Precautions/Restrictions  Precautions Precautions: Fall Required Braces or Orthoses: Sling (for R UE support when OOB) Restrictions Weight Bearing Restrictions: No General Chart Reviewed: Yes Family/Caregiver Present: No Vital Signs Therapy Vitals Temp: 98.2 F (36.8 C) Temp Source: Oral Pulse Rate: 71 Resp: 16 BP: 126/61 Patient Position (if appropriate): Sitting Oxygen Therapy SpO2: 96 % O2 Device: Not Delivered Pain No c/o pain during session    Home Living/Prior Functioning Home Living Available Help at Discharge: Family, Friend(s), Available 24 hours/day Type of Home: House Home Access: Stairs to enter Technical brewer  of Steps:  1step Entrance Stairs-Rails: None Home Layout: One level Bathroom Shower/Tub: Multimedia programmer: Associate Professor Accessibility: Yes  Lives With: Spouse, Family IADL History Homemaking Responsibilities: Yes Meal Prep Responsibility: Secondary Laundry Responsibility: No Cleaning Responsibility: No Bill Paying/Finance Responsibility: Primary Shopping Responsibility: Secondary Child Care Responsibility: No 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 Leisure and Hobbies: Biomedical scientist, farming, "helping others" Prior Function Level of Independence: Independent with basic ADLs, Independent with homemaking with ambulation, Independent with transfers  Able to Take Stairs?: Yes Driving: Yes Vocation: Self employed Leisure: Hobbies-yes (Comment) Comments: See above ADL ADL ADL Comments: Please see functional navigator for ADL status Vision/Perception  Vision- History Baseline Vision/History: No visual deficits Patient Visual Report: No change from baseline Vision- Assessment Vision Assessment?: No apparent visual deficits;Yes Eye Alignment: Within Functional Limits Tracking/Visual Pursuits: Able to track stimulus in all quads without difficulty Convergence: Within functional limits Visual Fields: No apparent deficits  Cognition Overall Cognitive Status: Impaired/Different from baseline Arousal/Alertness: Awake/alert Orientation Level: Person;Place;Situation Person: Oriented Place: Oriented Situation: Oriented Year: 2017 Month: December Day of Week: Correct Memory: Appears intact Immediate Memory Recall: Sock;Blue;Bed Memory Recall: Blue;Bed Memory Recall Blue: Without Cue Memory Recall Bed: Without Cue Attention: Sustained Sustained Attention: Appears intact Selective Attention: Appears intact Awareness: Appears intact Problem Solving: Impaired Problem Solving Impairment: Functional  complex Safety/Judgment: Appears intact Comments: Pt reports generalized "slowness" s/p stroke Sensation Sensation Light Touch: Impaired by gross assessment Stereognosis: Not tested Hot/Cold: Impaired by gross assessment Proprioception: Impaired by gross assessment Additional Comments: Significant sensory deficits in R UE  Coordination Gross Motor Movements are Fluid and Coordinated: No Fine Motor Movements are Fluid and Coordinated: No Coordination and Movement Description: Motor movements affected by hemiparesis and ataxia  Finger Nose Finger Test: overshooting with R UE Motor  Motor Motor: Hemiplegia;Ataxia Mobility  Transfers Transfers: Sit to Stand;Stand to Sit Sit to Stand: 3: Mod assist Sit to Stand Details: Manual facilitation for weight shifting;Verbal  cues for technique;Verbal cues for precautions/safety Stand to Sit: 3: Mod assist Stand to Sit Details (indicate cue type and reason): Verbal cues for precautions/safety;Verbal cues for technique;Manual facilitation for weight shifting  Trunk/Postural Assessment  Cervical Assessment Cervical Assessment: Exceptions to [facility] (Forward head) Thoracic Assessment Thoracic Assessment: Exceptions to [facility] (Kyphosis) Lumbar Assessment Lumbar Assessment: Exceptions to [facility] (Posterior pelvic tilt) Postural Control Postural Control: Deficits on evaluation (Right sided lean when seated or standing)  Balance Balance Balance Assessed: Yes Extremity/Trunk Assessment RUE Assessment RUE Assessment: Exceptions to [facility] (3-/5 MMT) LUE Assessment LUE Assessment: Within Functional Limits   See Function Navigator for Current Functional Status.   Refer to Care Plan for Long Term Goals  Recommendations for other services: None  and Therapeutic Recreation  Pet therapy and Outing/community reintegration   Discharge Criteria: Patient will be discharged from OT if patient refuses treatment 3 consecutive times without medical reason, if  treatment goals not met, if there is a change in medical status, if patient makes no progress towards goals or if patient is discharged from hospital.  The above assessment, treatment plan, treatment alternatives and goals were discussed and mutually agreed upon: by patient  Skeet Simmer 11/18/2016, 4:59 PM

## 2016-11-18 NOTE — Evaluation (Signed)
Physical Therapy Assessment and Plan  Patient Details  Name: [name] MRN: [id number] Date of Birth: 12-Dec-1950  PT Diagnosis: Abnormality of gait, Ataxia, Ataxic gait, Coordination disorder, Difficulty walking, Impaired sensation, Muscle weakness, Osteoarthritis and Pain in R knee Rehab Potential: Good ELOS: 2-2.5 weeks   Today's Date: 11/18/2016 PT Individual Time: 2993-7169 PT Individual Time Calculation (min): 50 min     Problem List:  Patient Active Problem List   Diagnosis Date Noted  . ICH (intracerebral hemorrhage) ([place]) 11/17/2016    Past Medical History:  Past Medical History:  Diagnosis Date  . HTN (hypertension)   . OA (osteoarthritis) of knee    endstage OA bilateral knees--was recently cleared for surgery  . Right leg swelling   . Right shoulder injury 2015   Past Surgical History:  Past Surgical History:  Procedure Laterality Date  . 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  He was started on IV nifedipine and BP medications adjusted for tighter control.  Neurology recommended BP control for management of bleed. 2 D echo with definity revealed mild concentric LVH, mild left atrial dilatation, EF 60-65% and impaired relaxation. Therapy initiated and patient limited by right side weakness with sensory changes, dysarthria and mild dysphagia.  Patient transferred to CIR on 11/17/2016 .   Patient currently requires max with mobility secondary to muscle weakness, decreased cardiorespiratoy endurance, impaired timing and sequencing, unbalanced muscle activation, ataxia, decreased coordination and decreased motor planning, decreased attention to  right and decreased standing balance, decreased postural control and decreased balance strategies.  Prior to hospitalization, patient was independent  with mobility and lived with Spouse in a House home.  Home access is 2Stairs to enter.  Patient will benefit from skilled PT intervention to maximize safe functional mobility, minimize fall risk and decrease caregiver burden for planned discharge home with 24 hour supervision.  Anticipate patient will benefit from follow up OP at discharge.  PT - End of Session Activity Tolerance: Tolerates 10 - 20 min activity with multiple rests Endurance Deficit: Yes Endurance Deficit Description: multiple rest breaks PT Assessment Rehab Potential (ACUTE/IP ONLY): Good Barriers to Discharge: Inaccessible home environment (no UE support for home entry/exit) PT Patient demonstrates impairments in the following area(s): Balance;Endurance;Motor;Pain;Sensory;Safety PT Transfers Functional Problem(s): Bed Mobility;Bed to Chair;Car;Furniture PT Locomotion Functional Problem(s): Ambulation;Wheelchair Mobility;Stairs PT Plan PT Intensity: Minimum of 1-2 x/day ,45 to 90 minutes PT Frequency: 5 out of 7 days PT Duration Estimated Length of Stay: 2-2.5 weeks PT Treatment/Interventions: Ambulation/gait training;Balance/vestibular training;Community reintegration;Discharge planning;Disease management/prevention;DME/adaptive equipment instruction;Functional mobility training;Neuromuscular re-education;Pain management;Patient/family education;Psychosocial support;Splinting/orthotics;Stair training;Therapeutic Activities;Therapeutic Exercise;UE/LE Strength taining/ROM;UE/LE Coordination activities;Visual/perceptual remediation/compensation;Wheelchair propulsion/positioning PT Transfers Anticipated Outcome(s): Mod I PT Locomotion Anticipated Outcome(s): Supervision PT Recommendation Recommendations for Other Services: Neuropsych consult Follow Up Recommendations: Outpatient  PT Patient destination: Home Equipment Recommended: To be determined Equipment Details: RW vs hemiwalker/cane  Skilled Therapeutic Intervention Pt received in recliner with wife present; pt grateful for PT evaluation being moved to pm due to fatigue in the am after OT and SLP sessions.  Pt participated in PT evaluation with focus on assessment of LE and UE sensation, strength and coordination.  Pt  performed recliner > w/c and w/c <> car transfers, w/c mobility, gait and stair negotiation assessment as documented below.  Discussed PLOF, home set up, equipment and assistance available at D/C, ELOS and goals with pt and wife.  Both agreeable.  At end of session pt left in w/c with wife present to supervise.  PT Evaluation Precautions/Restrictions Precautions Precautions: Fall Precaution Comments: Absent R sided proprioception, severe OA R knee-don knee support brace General Chart Reviewed: Yes Response to Previous Treatment: Patient with no complaints from previous session. Family/Caregiver Present: Yes Vital SignsTherapy Vitals Temp: 98.5 F (36.9 C) Temp Source: Oral Pulse Rate: 72 Resp: 17 BP: 136/68 Patient Position (if appropriate): Sitting Oxygen Therapy SpO2: 96 % O2 Device: Not Delivered Pain Pain Assessment Pain Assessment: 0-10 Pain Score: 5  Pain Type: Chronic pain Pain Location: Knee Pain Orientation: Right Pain Descriptors / Indicators: Aching;Discomfort Pain Onset: On-going Pain Intervention(s): Other (Comment) (knee support brace donned) Home Living/Prior Functioning Home Living Available Help at Discharge: Family;Available 24 hours/day Type of Home: House Home Access: Stairs to enter [employer] of Steps: 2 Entrance Stairs-Rails: None Home Layout: One level  Lives With: Spouse Prior Function Level of Independence: Independent with gait;Independent with transfers  Able to Take Stairs?: Yes Driving: Yes Vocation: Self employed Vision/Perception    Decreased attention to R body  Sensation Sensation Light Touch: Impaired Detail Light Touch Impaired Details: Impaired RUE;Impaired RLE Stereognosis: Not tested Hot/Cold: Not tested Proprioception: Impaired Detail Proprioception Impaired Details: Impaired RUE;Impaired RLE Coordination Gross Motor Movements are Fluid and Coordinated: No Fine Motor Movements are Fluid and Coordinated: No Coordination and Movement Description: ataxia, dysmetria Motor  Motor Motor: Hemiplegia;Ataxia  Mobility Transfers Transfers: Yes Stand Pivot Transfers: 2: Max assist;With armrests Locomotion  Ambulation Ambulation: Yes Ambulation/Gait Assistance: 3: Mod assist Ambulation Distance (Feet): 21 Feet Assistive device: Hemi-walker Gait Gait: Yes Gait Pattern: Impaired Gait Pattern: Step-to pattern;Decreased step length - right;Decreased step length - left;Decreased stance time - right;Decreased hip/knee flexion - right;Ataxic;Decreased trunk rotation;Narrow base of support;Poor foot clearance - right Stairs / Additional Locomotion Stairs: Yes Stairs Assistance: 2: Max Armed forces technical officer Details (indicate cue type and reason): Required max A for balance, to control weight shifting, to attend to RUE placement on rail and verbal cues for sequence Stair Management Technique: Two rails;Step to pattern;Forwards Number of Stairs: 8 Height of Stairs: 3 Architect: Yes Wheelchair Assistance: 1: +1 Total assist Wheelchair Propulsion: Both upper extremities Wheelchair Parts Management: Needs assistance Distance: 10' with increased difficulty attending to and sequencing RUE use for propulsion-required hand over hand and total verbal cues to sequence and control  Trunk/Postural Assessment  Cervical Assessment Cervical Assessment: Within Functional Limits Thoracic Assessment Thoracic Assessment: Within Functional Limits Lumbar Assessment Lumbar Assessment: Within  Functional Limits Postural Control Postural Control: Deficits on evaluation  Balance Balance Balance Assessed: Yes Static Standing Balance Static Standing - Balance Support: Left upper extremity supported Static Standing - Level of Assistance: 4: Min assist Dynamic Standing Balance Dynamic Standing - Balance Support: Left upper extremity supported Dynamic Standing - Level of Assistance: 2: Max assist;3: Mod assist Extremity Assessment  RLE Assessment RLE Assessment: Exceptions to [facility] RLE Strength RLE Overall Strength: Other (Comment) (4+/5 strength but impaired coordination and muscle grading) LLE Assessment LLE Assessment: Within Functional Limits   See Function Navigator for Current Functional Status.   Refer to Care Plan for Long Term Goals  Recommendations for other services: Neuropsych and Therapeutic Recreation  Stress management and Outing/community reintegration  Discharge Criteria: Patient will be discharged from PT if patient refuses treatment 3 consecutive times without medical reason, if treatment goals not met, if there is a change in medical status, if patient makes no progress towards goals or if patient is discharged from hospital.  The above assessment, treatment plan, treatment alternatives and goals were discussed and mutually agreed upon: by patient and by family  Malachy Mood 11/19/2016, 7:52 AM

## 2016-11-18 NOTE — Evaluation (Signed)
Speech Language Pathology Assessment and Plan  Patient Details  Name: [name] MRN: [id number] Date of Birth: 1951-07-10  SLP Diagnosis: Dysarthria;Dysphagia;Cognitive Impairments  Rehab Potential: Good ELOS: 14-17 days     Today's Date: 11/18/2016 SLP Individual Time: 9373-4287 SLP Individual Time Calculation (min): 60 min    Problem List:  Patient Active Problem List   Diagnosis Date Noted  . ICH (intracerebral hemorrhage) ([place]) 11/17/2016   Past Medical History:  Past Medical History:  Diagnosis Date  . HTN (hypertension)   . OA (osteoarthritis) of knee    endstage OA bilateral knees--was recently cleared for surgery  . Right leg swelling   . Right shoulder injury 2015   Past Surgical History:  Past Surgical History:  Procedure Laterality Date  . TONSILLECTOMY      Assessment / Plan / Recommendation Clinical Impression   [name] is a 65 year old RH-male who was admitted to [facility]  11/14/16 with right sided weakness and slurred speech.  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  herapy initiated and patient limited by right side weakness with sensory changes, dysarthria and mild dysphagia. CIR recommended for follow up therapy. Chart reviewed by MD and rehab CM and patient felt to be a good candidate for intensive rehab program.  Pt admitted to CIR on 11/17/2016.  SLP evaluation completed on 11/18/2016 with the following results:  Pt presents with a mild orally based dysphagia resulting from right sided oral motor weakness characterized by impaired mastication and prolonged oral transit of solids.  Pt able to clear solids from the oral cavity with extra time.  No overt s/s of aspiration evident with solids or liquids.   Pt also presents with a mild dysarthria from the abovementioned oral motor deficits which results in imprecise articulation of consonants.  Pt is fully intelligible in  conversations but reports not being back to baseline for articulatory precision.   Pt also endorses generalized cognitive slowness post CVA but scored WFL on targeted subtests of standardized assessment. Given the abovementioned deficits and pt report, pt would benefit from skilled ST follow up while inpatient for ongoing diagnostic treatment of cognitive function and treatment of dysphagia and dysarthria in order to maximize functional independence and reduce burden of care prior to discharge.    Skilled Therapeutic Interventions          Cognitive-linguistic evaluation and bedside swallow completed with results and recommendations reviewed with family. Pt was able to recall and utilize recommended swallowing precautions from previous facility with mod I and reports overall good toleration of current diet with the exception of biting his tongue.  Tip of tongue noted to be red and ulcerated on the right upon inspection.  Discussed rationale of ST interventions while inpatient for maximizing return to previous level of function.  Pt agreeable to ST involvement.  Pt left in recliner with call bell within reach.      SLP Assessment  Patient will need skilled Speech Lanaguage Pathology Services during CIR admission    Recommendations  SLP Diet Recommendations: Dysphagia 3 (Mech soft);Thin Liquid Administration via: Cup;Straw Medication Administration: Whole meds with liquid Supervision: Patient able to self feed;Intermittent supervision to cue for compensatory strategies Compensations: Slow rate;Small sips/bites;Lingual sweep for clearance of pocketing Postural Changes and/or Swallow Maneuvers: Out of bed for meals Oral Care Recommendations: Oral care BID Patient destination: Home Follow up Recommendations: Outpatient SLP Equipment Recommended: None recommended by SLP  SLP Frequency 3 to 5 out of 7 days   SLP Duration  SLP Intensity  SLP Treatment/Interventions 14-17 days   Minumum of 1-2  x/day, 30 to 90 minutes  Cognitive remediation/compensation;Cueing hierarchy;Functional tasks;Dysphagia/aspiration precaution training;Environmental controls;Internal/external aids;Patient/family education;Oral motor exercises    Pain Pain Assessment Pain Assessment: No/denies pain  Prior Functioning Cognitive/Linguistic Baseline: Within functional limits Type of Home: House  Lives With: Spouse;Family Available Help at Discharge: Family;Friend(s);Available 24 hours/day Vocation: Full time employment  Function:  Eating Eating   Modified Consistency Diet: Yes Eating Assist Level: Set up assist for;More than reasonable amount of time   Eating Set Up Assist For: Opening containers       Cognition Comprehension Comprehension assist level: Follows complex conversation/direction with extra time/assistive device  Expression   Expression assist level: Expresses basic 75 - 89% of the time/requires cueing 10 - 24% of the time. Needs helper to occlude trach/needs to repeat words.  Social Interaction Social Interaction assist level: Interacts appropriately with others with medication or extra time (anti-anxiety, antidepressant).  Problem Solving Problem solving assist level: Solves basic 90% of the time/requires cueing < 10% of the time  Memory Memory assist level: Recognizes or recalls 90% of the time/requires cueing < 10% of the time   Short Term Goals: Week 1: SLP Short Term Goal 1 (Week 1): Pt will utilize overarticulation to achieve articulatory precision in conversations with mod I.  SLP Short Term Goal 2 (Week 1): Pt will complete semi-complex home management tasks with mod I functional problem solving.   SLP Short Term Goal 3 (Week 1): Pt will consume regular textures with mod I use of swallowing precautions over 2 consecutive sessions prior to advancement.    Refer to Care Plan for Long Term Goals  Recommendations for other services: Neuropsych  Discharge Criteria: Patient will  be discharged from SLP if patient refuses treatment 3 consecutive times without medical reason, if treatment goals not met, if there is a change in medical status, if patient makes no progress towards goals or if patient is discharged from hospital.  The above assessment, treatment plan, treatment alternatives and goals were discussed and mutually agreed upon: by patient  Emilio Math 11/18/2016, 3:51 PM

## 2016-11-18 NOTE — Progress Notes (Signed)
Justin FillerStephen [name] is a 65 y.o. male 03/20/1951 [id number]  Subjective: No new complaints. No new problems. Feeling OK.  Objective: Vital signs in last 24 hours: Temp:  [97.7 F (36.5 C)-98.1 F (36.7 C)] 97.7 F (36.5 C) (12/16 0600) Pulse Rate:  [69-79] 79 (12/16 1000) Resp:  [18-20] 18 (12/16 0600) BP: (134-148)/(67-75) 148/73 (12/16 1000) SpO2:  [97 %] 97 % (12/16 0600) Weight change:     Intake/Output from previous day: 12/15 0701 - 12/16 0700 In: 120 [P.O.:120] Out: 900 [Urine:900]  Physical Exam General: No apparent distress    Lungs: Normal effort. Lungs clear to auscultation, no crackles or wheezes. Cardiovascular: Regular rate and rhythm Neurological: No new neurological deficits   Lab Results: BMET    Component Value Date/Time   NA 135 11/18/2016 0412   K 4.0 11/18/2016 0412   CL 101 11/18/2016 0412   CO2 22 11/18/2016 0412   GLUCOSE 95 11/18/2016 0412   BUN 20 11/18/2016 0412   CREATININE 0.89 11/18/2016 0412   CALCIUM 9.1 11/18/2016 0412   GFRNONAA >60 11/18/2016 0412   GFRAA >60 11/18/2016 0412   CBC    Component Value Date/Time   WBC 9.2 11/18/2016 0412   RBC 4.89 11/18/2016 0412   HGB 14.7 11/18/2016 0412   HCT 44.0 11/18/2016 0412   PLT 284 11/18/2016 0412   MCV 90.0 11/18/2016 0412   MCH 30.1 11/18/2016 0412   MCHC 33.4 11/18/2016 0412   RDW 12.6 11/18/2016 0412   LYMPHSABS 2.2 11/18/2016 0412   MONOABS 1.0 11/18/2016 0412   EOSABS 0.1 11/18/2016 0412   BASOSABS 0.1 11/18/2016 0412   CBG's (last 3):   Recent Labs  11/17/16 1718 11/17/16 2110 11/18/16 0631  GLUCAP 89 173* 126*   LFT's Lab Results  Component Value Date   ALT 34 11/18/2016   AST 25 11/18/2016   ALKPHOS 44 11/18/2016   BILITOT 0.8 11/18/2016    Studies/Results: Mr Brain Wo Contrast  Result Date: 11/18/2016 CLINICAL DATA:  65 y/o  M; intracranial hemorrhage. EXAM: MRI HEAD WITHOUT CONTRAST TECHNIQUE: Multiplanar, multiecho pulse sequences of the brain and  surrounding structures were obtained without intravenous contrast. COMPARISON:  Head CT dated 11/15/2016 and 11/14/2016. FINDINGS: Brain: The hematoma centered within the left thalamus extending into left posterior corona radiata is stable in comparison with prior CT given differences in technique. Hematoma is largely T1 heterogeneous and T2 hypointense compatible with acute/ early subacute blood products. There is a small surrounding area of vasogenic edema and local mass effect. There is peripheral diffusion hyperintensity of the hematoma fat is probably due to susceptibility artifact from the presence of blood products. There is no additional diffusion abnormality to suggest a superimposed acute or early subacute infarction. Few foci of T2 FLAIR hyperintensity in subcortical periventricular white matter compatible mild chronic microvascular ischemic changes. Mild brain parenchymal volume loss. No extra-axial collection. No herniation. No effacement of basilar cisterns. Normal ventricle size Vascular: Normal flow voids. Skull and upper cervical spine: Normal marrow signal. Sinuses/Orbits: Mild maxillary sinus mucosal thickening. No abnormal signal of mastoid air cells. Orbits are unremarkable. Other: Negative. IMPRESSION: 1. Acute/early subacute hemorrhage within the left thalamus extending into left posterior corona radiata and small surrounding a region of vasogenic edema and local mass effect. No additional hemorrhage is identified. 2. No additional focus of diffusion signal abnormality to suggest a superimposed infarct. The hemorrhages is likely hypertensive in etiology given central distribution. 3. Background of mild chronic microvascular ischemic changes and parenchymal volume  loss. Electronically Signed   By: Mitzi HansenLance  Furusawa-Stratton M.D.   On: 11/18/2016 00:47    Medications:  I have reviewed the patient's current medications. Scheduled Medications: . carvedilol  3.125 mg Oral BID WC  . diclofenac  sodium  2 g Topical QID  . enoxaparin (LOVENOX) injection  40 mg Subcutaneous Q24H  . hydrochlorothiazide  25 mg Oral Daily  . insulin aspart  0-5 Units Subcutaneous QHS  . insulin aspart  0-9 Units Subcutaneous TID WC  . losartan  100 mg Oral Daily  . naphazoline-glycerin  2 drop Both Eyes TID WC & HS   PRN Medications: acetaminophen, alum & mag hydroxide-simeth, bisacodyl, cloNIDine, diphenhydrAMINE, guaiFENesin-dextromethorphan, ipratropium-albuterol, polyethylene glycol, prochlorperazine **OR** prochlorperazine **OR** prochlorperazine, sodium phosphate, traMADol, traZODone  Assessment/Plan: Active Problems:   ICH (intracerebral hemorrhage) ([facility])  1.  Functional and mobility deficits secondary to left thalamic hemorrhage             -continue CIR 2.  DVT Prophylaxis/Anticoagulation: Pharmaceutical: Lovenox 3. Pain Management:  use tramadol prn for knee pain/shoulder pain. Monitor for symptoms as activity levels improve.  4. Mood: Team to provide ego support. Under lot of stress due to business commitments/end of year. LCSW to follow for evaluation and support.  5. Neuropsych: This patient is capable of making decisions on his own behalf. 6. Skin/Wound Care: Routine pressure relief measures. Maintain adequate nutritional and hydration status.  7. Fluids/Electrolytes/Nutrition: Monitor I/O. Check lytes in am. 8. HTN: Monitor BP bid--on HCTZ and cozaar. Monitor renal status.  9. 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 Prediabetes: Hgb A1c- 6.3. Will add CM restrictions. Consult RD to educate patient on diet.  12. Neuro work up for #1: 12/16 MRI reviewed. Recent ECHO results on chart             -consider formal neuro consult, continue med mgmt  Length of stay, days: 1   Lucella Pommier A. Felicity CoyerLeschber, MD 11/18/2016, 11:02 AM

## 2016-11-19 LAB — GLUCOSE, CAPILLARY
GLUCOSE-CAPILLARY: 112 mg/dL — AB (ref 65–99)
GLUCOSE-CAPILLARY: 118 mg/dL — AB (ref 65–99)
Glucose-Capillary: 206 mg/dL — ABNORMAL HIGH (ref 65–99)
Glucose-Capillary: 113 mg/dL — ABNORMAL HIGH (ref 65–99)

## 2016-11-19 NOTE — Progress Notes (Signed)
Gay FillerStephen Balsam is a 65 y.o. male 03/10/1951 [id number]  Subjective: Grateful and appreciative of excellent care in CIR - Committed to improved self care and medication compliance after DC home. Feels strength is recovering slowly  Objective: Vital signs in last 24 hours: Temp:  [98.2 F (36.8 C)-98.5 F (36.9 C)] 98.5 F (36.9 C) (12/17 0523) Pulse Rate:  [71-72] 72 (12/17 0758) Resp:  [16-17] 17 (12/17 0523) BP: (125-136)/(61-77) 127/73 (12/17 0758) SpO2:  [96 %] 96 % (12/17 0523) Weight change:     Intake/Output from previous day: 12/16 0701 - 12/17 0700 In: 600 [P.O.:600] Out: 950 [Urine:950]  Physical Exam General: No apparent distress    Lungs: Normal effort. Lungs clear to auscultation, no crackles or wheezes. Cardiovascular: Regular rate and rhythm Neurological: No new neurological deficits   Lab Results: BMET    Component Value Date/Time   NA 135 11/18/2016 0412   K 4.0 11/18/2016 0412   CL 101 11/18/2016 0412   CO2 22 11/18/2016 0412   GLUCOSE 95 11/18/2016 0412   BUN 20 11/18/2016 0412   CREATININE 0.89 11/18/2016 0412   CALCIUM 9.1 11/18/2016 0412   GFRNONAA >60 11/18/2016 0412   GFRAA >60 11/18/2016 0412   CBC    Component Value Date/Time   WBC 9.2 11/18/2016 0412   RBC 4.89 11/18/2016 0412   HGB 14.7 11/18/2016 0412   HCT 44.0 11/18/2016 0412   PLT 284 11/18/2016 0412   MCV 90.0 11/18/2016 0412   MCH 30.1 11/18/2016 0412   MCHC 33.4 11/18/2016 0412   RDW 12.6 11/18/2016 0412   LYMPHSABS 2.2 11/18/2016 0412   MONOABS 1.0 11/18/2016 0412   EOSABS 0.1 11/18/2016 0412   BASOSABS 0.1 11/18/2016 0412   CBG's (last 3):    Recent Labs  11/18/16 1701 11/18/16 2053 11/19/16 0648  GLUCAP 94 122* 112*   LFT's Lab Results  Component Value Date   ALT 34 11/18/2016   AST 25 11/18/2016   ALKPHOS 44 11/18/2016   BILITOT 0.8 11/18/2016    Studies/Results: Mr Brain Wo Contrast  Result Date: 11/18/2016 CLINICAL DATA:  65 y/o  M;  intracranial hemorrhage. EXAM: MRI HEAD WITHOUT CONTRAST TECHNIQUE: Multiplanar, multiecho pulse sequences of the brain and surrounding structures were obtained without intravenous contrast. COMPARISON:  Head CT dated 11/15/2016 and 11/14/2016. FINDINGS: Brain: The hematoma centered within the left thalamus extending into left posterior corona radiata is stable in comparison with prior CT given differences in technique. Hematoma is largely T1 heterogeneous and T2 hypointense compatible with acute/ early subacute blood products. There is a small surrounding area of vasogenic edema and local mass effect. There is peripheral diffusion hyperintensity of the hematoma fat is probably due to susceptibility artifact from the presence of blood products. There is no additional diffusion abnormality to suggest a superimposed acute or early subacute infarction. Few foci of T2 FLAIR hyperintensity in subcortical periventricular white matter compatible mild chronic microvascular ischemic changes. Mild brain parenchymal volume loss. No extra-axial collection. No herniation. No effacement of basilar cisterns. Normal ventricle size Vascular: Normal flow voids. Skull and upper cervical spine: Normal marrow signal. Sinuses/Orbits: Mild maxillary sinus mucosal thickening. No abnormal signal of mastoid air cells. Orbits are unremarkable. Other: Negative. IMPRESSION: 1. Acute/early subacute hemorrhage within the left thalamus extending into left posterior corona radiata and small surrounding a region of vasogenic edema and local mass effect. No additional hemorrhage is identified. 2. No additional focus of diffusion signal abnormality to suggest a superimposed infarct. The hemorrhages is  likely hypertensive in etiology given central distribution. 3. Background of mild chronic microvascular ischemic changes and parenchymal volume loss. Electronically Signed   By: Mitzi HansenLance  Furusawa-Stratton M.D.   On: 11/18/2016 00:47    Medications:  I  have reviewed the patient's current medications. Scheduled Medications: . carvedilol  3.125 mg Oral BID WC  . diclofenac sodium  2 g Topical QID  . enoxaparin (LOVENOX) injection  40 mg Subcutaneous Q24H  . hydrochlorothiazide  25 mg Oral Daily  . insulin aspart  0-5 Units Subcutaneous QHS  . insulin aspart  0-9 Units Subcutaneous TID WC  . losartan  100 mg Oral Daily  . naphazoline-glycerin  2 drop Both Eyes TID WC & HS   PRN Medications: acetaminophen, alum & mag hydroxide-simeth, bisacodyl, cloNIDine, diphenhydrAMINE, guaiFENesin-dextromethorphan, ipratropium-albuterol, polyethylene glycol, prochlorperazine **OR** prochlorperazine **OR** prochlorperazine, sodium phosphate, traMADol, traZODone  Assessment/Plan: Active Problems:   ICH (intracerebral hemorrhage) (HCC)  1.  Functional and mobility deficits secondary to left thalamic hemorrhage             -continue CIR 2.  DVT Prophylaxis/Anticoagulation: Pharmaceutical: Lovenox 3. Pain Management:  use tramadol prn for knee pain/shoulder pain. Monitor for symptoms as activity levels improve.  4. Mood: Team to provide ego support. Under lot of stress due to business commitments/end of year. LCSW to follow for evaluation and support.  5. Neuropsych: This patient is capable of making decisions on his own behalf. 6. Skin/Wound Care: Routine pressure relief measures. Maintain adequate nutritional and hydration status.  7. Fluids/Electrolytes/Nutrition: Monitor I/O. Check lytes in am. 8. HTN: Monitor BP bid--on HCTZ and cozaar. Monitor renal status.  9. 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 Prediabetes: Hgb A1c- 6.3. Will add CM restrictions. Consult RD to educate patient on diet.  12. Neuro work up for #1: 12/16 MRI reviewed. Recent ECHO results on chart             -consider formal neuro consult,  continue med mgmt  Length of stay, days: 2   Winter Trefz A. Felicity CoyerLeschber, MD 11/19/2016, 10:13 AM

## 2016-11-20 ENCOUNTER — Inpatient Hospital Stay (HOSPITAL_COMMUNITY): Payer: PPO | Admitting: Speech Pathology

## 2016-11-20 ENCOUNTER — Inpatient Hospital Stay (HOSPITAL_COMMUNITY): Payer: PPO | Admitting: Physical Therapy

## 2016-11-20 ENCOUNTER — Inpatient Hospital Stay (HOSPITAL_COMMUNITY): Payer: PPO | Admitting: Occupational Therapy

## 2016-11-20 DIAGNOSIS — I69993 Ataxia following unspecified cerebrovascular disease: Secondary | ICD-10-CM

## 2016-11-20 DIAGNOSIS — R209 Unspecified disturbances of skin sensation: Secondary | ICD-10-CM

## 2016-11-20 DIAGNOSIS — I69398 Other sequelae of cerebral infarction: Secondary | ICD-10-CM

## 2016-11-20 LAB — GLUCOSE, CAPILLARY
GLUCOSE-CAPILLARY: 118 mg/dL — AB (ref 65–99)
GLUCOSE-CAPILLARY: 122 mg/dL — AB (ref 65–99)
Glucose-Capillary: 113 mg/dL — ABNORMAL HIGH (ref 65–99)
Glucose-Capillary: 101 mg/dL — ABNORMAL HIGH (ref 65–99)

## 2016-11-20 NOTE — IPOC Note (Signed)
Overall Plan of Care [facility](IPOC) Patient Details Name: Justin FillerStephen [name] MRN: [id number] DOB: 01/27/1951  Admitting Diagnosis: cva  Hospital Problems: Active Problems:   ICH (intracerebral hemorrhage) (HCC)     Functional Problem List: Nursing Endurance, Medication Management, Sensory, Safety  PT Balance, Endurance, Motor, Pain, Sensory, Safety  OT Balance, Safety, Sensory, Endurance, Cognition, Motor, Perception  SLP Cognition, Nutrition  TR         Basic ADL's: OT Grooming, Bathing, Dressing, Toileting     Advanced  ADL's: OT Simple Meal Preparation     Transfers: PT Bed Mobility, Bed to Chair, Car, Occupational psychologisturniture  OT Toilet, Research scientist (life sciences)Tub/Shower     Locomotion: PT Ambulation, Psychologist, prison and probation servicesWheelchair Mobility, Stairs     Additional Impairments: OT Fuctional Use of Upper Extremity  SLP Swallowing, Communication, Social Cognition expression Problem Solving  TR      Anticipated Outcomes Item Anticipated Outcome  Self Feeding N/A  Swallowing  mod I    Basic self-care  Mod I   Toileting  Mod I    Bathroom Transfers Supervision-Mod I   Bowel/Bladder     Transfers  Mod I  Locomotion  Supervision  Communication  mod I   Cognition  mod I   Pain  maintain pain at or below level 5   Safety/Judgment  maintain safety with cues/supervision   Therapy Plan: PT Intensity: Minimum of 1-2 x/day ,45 to 90 minutes PT Frequency: 5 out of 7 days PT Duration Estimated Length of Stay: 2-2.5 weeks OT Intensity: Minimum of 1-2 x/day, 45 to 90 minutes OT Frequency: 5 out of 7 days OT Duration/Estimated Length of Stay: 2-2.5 weeks SLP Intensity: Minumum of 1-2 x/day, 30 to 90 minutes SLP Frequency: 3 to 5 out of 7 days SLP Duration/Estimated Length of Stay: 14-17 days        Team Interventions: Nursing Interventions Patient/Family Education, Skin Care/Wound Management, Discharge Planning, Pain Management, Psychosocial Support, Medication Management  PT interventions Ambulation/gait training,  Warden/rangerBalance/vestibular training, Community reintegration, Discharge planning, Disease management/prevention, DME/adaptive equipment instruction, Functional mobility training, Neuromuscular re-education, Pain management, Patient/family education, Psychosocial support, Splinting/orthotics, Stair training, Therapeutic Activities, Therapeutic Exercise, UE/LE Strength taining/ROM, UE/LE Coordination activities, Visual/perceptual remediation/compensation, Wheelchair propulsion/positioning  OT Interventions Warden/rangerBalance/vestibular training, Discharge planning, Self Care/advanced ADL retraining, Therapeutic Activities, UE/LE Coordination activities, Functional mobility training, Patient/family education, Therapeutic Exercise, Visual/perceptual remediation/compensation, Neuromuscular re-education, Psychosocial support, UE/LE Strength taining/ROM  SLP Interventions Cognitive remediation/compensation, Cueing hierarchy, Functional tasks, Dysphagia/aspiration precaution training, Environmental controls, Internal/external aids, Patient/family education, Oral motor exercises  TR Interventions    SW/CM Interventions Discharge Planning, Psychosocial Support, Patient/Family Education    Team Discharge Planning: Destination: PT-Home ,OT- Home , SLP-Home Projected Follow-up: PT-Outpatient PT, OT-  Home health OT, Outpatient OT, SLP-Outpatient SLP Projected Equipment Needs: PT-To be determined, OT- Tub/shower seat, 3 in 1 bedside comode, SLP-None recommended by SLP Equipment Details: PT-RW vs hemiwalker/cane, OT-  Patient/family involved in discharge planning: PT- Patient, Family member/caregiver,  OT-Patient, SLP-Patient  MD ELOS: 8-12d Medical Rehab Prognosis:  Good Assessment: 65 year old RH-male in recent diagnosis of HTN (no MD follow up for 10+ years), endstage OA R>L knee who [facility] with right sided weakness and slurred speech.  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  He was started on IV nifedipine and BP medications adjusted for tighter control.  Neurology recommended BP control for management of bleed. 2 D echo with definity revealed mild concentric  LVH, mild left atrial dilatation, EF 60-65% and impaired relaxation.    Now requiring 24/7 Rehab RN,MD, as well as CIR level PT, OT and SLP.  Treatment team will focus on ADLs and mobility with goals set at Mod I See Team Conference Notes for weekly updates to the plan of care

## 2016-11-20 NOTE — Care Management Note (Signed)
Inpatient Rehabilitation Center Individual Statement of Services  Patient Name:  Justin FillerStephen Crego  Date:  11/20/2016  Welcome to the Inpatient Rehabilitation Center.  Our goal is to provide you with an individualized program based on your diagnosis and situation, designed to meet your specific needs.  With this comprehensive rehabilitation program, you will be expected to participate in at least 3 hours of rehabilitation therapies Monday-Friday, with modified therapy programming on the weekends.  Your rehabilitation program will include the following services:  Physical Therapy (PT), Occupational Therapy (OT), Speech Therapy (ST), 24 hour per day rehabilitation nursing, Therapeutic Recreaction (TR), Neuropsychology, Case Management (Social Worker), Rehabilitation Medicine, Nutrition Services and Pharmacy Services  Weekly team conferences will be held on Wednesday to discuss your progress.  Your Social Worker will talk with you frequently to get your input and to update you on team discussions.  Team conferences with you and your family in attendance may also be held.  Expected length of stay: 2-2.5 weeks  Overall anticipated outcome: mod/i-supervision level  Depending on your progress and recovery, your program may change. Your Social Worker will coordinate services and will keep you informed of any changes. Your Social Worker's name and contact numbers are listed  below.  The following services may also be recommended but are not provided by the Inpatient Rehabilitation Center:   Driving Evaluations  Home Health Rehabiltiation Services  Outpatient Rehabilitation Services  Vocational Rehabilitation   Arrangements will be made to provide these services after discharge if needed.  Arrangements include referral to agencies that provide these services.  Your insurance has been verified to be:  Health Team Advantage Your primary doctor is:  [facility]  Pertinent information will be  shared with your doctor and your insurance company.  Social Worker:  Dossie DerBecky Jlen Wintle, SW [phone] or [phone]) [phone]  Information discussed with and copy given to patient by: Lucy Chrisupree, [name], 11/20/2016, 10:26 AM

## 2016-11-20 NOTE — Progress Notes (Signed)
Speech Language Pathology Daily Session Note  Patient Details  Name: [name] MRN: [id number] Date of Birth: 02/06/1951  Today's Date: 11/20/2016 SLP Individual Time: 0802-0900 SLP Individual Time Calculation (min): 58 min   Short Term Goals: Week 1: SLP Short Term Goal 1 (Week 1): Pt will utilize overarticulation to achieve articulatory precision in conversations with mod I.  SLP Short Term Goal 2 (Week 1): Pt will complete semi-complex home management tasks with mod I functional problem solving.   SLP Short Term Goal 3 (Week 1): Pt will consume regular textures with mod I use of swallowing precautions over 2 consecutive sessions prior to advancement.    Skilled Therapeutic Interventions:  Pt was seen for skilled ST targeting ongoing diagnostic treatment of cognition.  SLP administered remaining portions of CLQT to assess areas of strengths and weakness for cognitive function.  Pt scored above cut off point for all tasks assessed and reports feeling closer to his baseline than on initial evaluation.  Pt was also able to count money, make change, and balance a check book for 100% accuracy with mod I.  Recommend d/c cognitive goals at this point given that pt now feels he is near his baseline.  Will continue to follow up for speech and swallowing goals.  Pt left in recliner with call bell within reach.  Continue per current plan of care.       Function:  Eating Eating                 Cognition Comprehension Comprehension assist level: Follows complex conversation/direction with extra time/assistive device  Expression   Expression assist level: Expresses basic needs/ideas: With extra time/assistive device  Social Interaction Social Interaction assist level: Interacts appropriately with others - No medications needed.  Problem Solving Problem solving assist level: Solves complex problems: With extra time  Memory Memory assist level: More than reasonable amount of time     Pain Pain Assessment Pain Assessment: No/denies pain  Therapy/Group: Individual Therapy  [name], Melanee SpryNicole L 11/20/2016, 9:03 AM

## 2016-11-20 NOTE — Progress Notes (Signed)
Patient information reviewed and entered into eRehab system by Edilia Ghuman, RN, CRRN, PPS Coordinator.  Information including medical coding and functional independence measure will be reviewed and updated through discharge.     Per nursing patient was given "Data Collection Information Summary for Patients in Inpatient Rehabilitation Facilities with attached "Privacy Act Statement-Health Care Records" upon admission.  

## 2016-11-20 NOTE — Progress Notes (Signed)
Physical Therapy Session Note  Patient Details  Name: [name] MRN: [id number] Date of Birth: 12-31-1950  Today's Date: 11/20/2016 PT Individual Time: 5488-4573 PT Individual Time Calculation (min): 51 min    Short Term Goals: Week 1:  PT Short Term Goal 1 (Week 1): Pt will perform bed mobility on flat bed with supervision PT Short Term Goal 2 (Week 1): Pt will perform bed <> chair transfers with min A and 25% cues for sequencing  PT Short Term Goal 3 (Week 1): Pt will perform w/c mobility x 50' with bilat UE propulsion for increased RUE attention, coordination. PT Short Term Goal 4 (Week 1): Pt will perform gait training x 75' with LRAD and min A PT Short Term Goal 5 (Week 1): Pt will negotiate 4 larger stairs with one UE support and min A  Skilled Therapeutic Interventions/Progress Updates:    Pt resting in w/c on arrival with no c/o pain and agreeable to therapy session.  Session focus on RLE/RUE NMR, transfers, and gait.  Pt slightly impulsive throughout session but responds well to verbal cues for pacing and safety.  W/C propulsion x50' with BLEs for reciprocal stepping and LE strengthening.  Sit<>stand from w/c with hemiwalker for support, marching in place for RLE NMR focus on placement of foot in same spot each time, 10 reps without weight +10 reps with 3# ankle weight.  Pt requires min assist for balance and min verbal cues for reduced L weight shift for improved midline orientation and balance.  Gait training x75' with EVA walker and overall min assist for balance and navigation through tight spaces.  Min cues for step length and RUE positioning on EVA.  NMR for RUE in sitting with 3# arm weight with clothespin task.  Pt returned to room in w/c at end of session and positioned upright with call bell in reach and needs met.   Therapy Documentation Precautions:  Precautions Precautions: Fall Precaution Comments: Absent R sided proprioception, severe OA R knee-don knee support  brace Required Braces or Orthoses: Sling (for R UE support when OOB) Restrictions Weight Bearing Restrictions: No   See Function Navigator for Current Functional Status.   Therapy/Group: Individual Therapy  Earnest Conroy Penven-Crew 11/20/2016, 3:57 PM

## 2016-11-20 NOTE — Progress Notes (Signed)
Social Work Assessment and Plan Social Work Assessment and Plan  Patient Details  Name: [name] MRN: [id number] Date of Birth: 11/30/1951  Today's Date: 11/20/2016  Problem List:  Patient Active Problem List   Diagnosis Date Noted  . ICH (intracerebral hemorrhage) (HCC) 11/17/2016   Past Medical History:  Past Medical History:  Diagnosis Date  . HTN (hypertension)   . OA (osteoarthritis) of knee    endstage OA bilateral knees--was recently cleared for surgery  . Right leg swelling   . Right shoulder injury 2015   Past Surgical History:  Past Surgical History:  Procedure Laterality Date  . TONSILLECTOMY     Social History:  reports that he has never smoked. He has never used smokeless tobacco. He reports that he does not drink alcohol or use drugs.  Family / Support Systems Marital Status: Married Patient Roles: Spouse, Parent, Other (Comment) Building surveyor(Owner) Spouse/Significant Other: [phone]  [phone] Children: Son who is local and a Runner, broadcasting/film/videoteacher Other Supports: 65 yo nephew lives with couple Anticipated Caregiver: Wife Ability/Limitations of Caregiver: Wife is fairly healthy and can assist if needed Caregiver Availability: 24/7 Family Dynamics: Close knit family who are there for one another, pt realizes he needs to change his lifestyle habits and his wif ewill help him and work with him on this. 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 He has always been independent and relied upon himself, but the stress and his lifestyle has caught up with him. He realizies he needs to follow up with th MD and take the medications presribed. Recent Psychosocial Issues: medical issues-HTN was not taking his medicines. Pyschiatric History: No history deferred depression screen due to doing well and able to verbalize his concerns and feelings. May benefit from seeing neuro-psych once settled into the rehab program, will discuss with therapy team. Substance Abuse History: No issues  Patient / Family Perceptions, Expectations & Goals Pt/Family understanding of illness & functional limitations: Pt and wife can explain his stroke and deficits. He has other health sisues bad knees and a bad shoulder which limit him in therapies. Both feel their concerns and questions are being addressed by the MD. Wife plans to be here daily to see his progress. Premorbid pt/family roles/activities: Husband, father, owner, church member, friend, uncle, etc Anticipated changes in roles/activities/participation: resume Pt/family expectations/goals: Pt states: " I want to be able to take care of myself when I leave here."  Wife states: " I hope he continues to do well and can be mobile by the time he leaves here."  [employer] Resources Community Agencies: None Premorbid Home Care/DME Agencies: None Transportation available at discharge: Wife Resource referrals recommended: Neuropsychology, Support group (specify)  Discharge Planning Living Arrangements: Spouse/significant other, Other relatives Support Systems: Spouse/significant other, Children, Other  relatives, Friends/neighbors, Psychologist, clinicalChurch/faith community Type of Residence: Private residence Insurance Resources: Media plannerrivate Insurance (specify) (Health Team Advantage) Financial Resources: Employment, Other (Comment) (rental properities) Financial Screen Referred: No Living Expenses: Lives with family Money Management: Patient, Spouse Does the patient have any problems obtaining your medications?: No Home Management: Wife does the home management Patient/Family Preliminary Plans: Return home with wife who can assist if needed. Pt does not want her to have to, he wants to be independent by the time he leaves here. Discussed team conference and will meet with after and go over goals and target discharge date. Wife plans to be here daily and will be involved in his therapies. Social Work Anticipated Follow Up Needs: HH/OP, Support Group  Clinical Impression Pleasant gentleman who is willing to change his lifestyle and become healthier. He has much stress in his businesses and feels he will need to allow others to assist with this. His wife is supportive and willing to assist if needed. He does have a PCP and have placed in chart. Will work on discharge needs. May benefit from seeing Neuro-psych while here for coping will get input from team.  Lucy ChrisDupree, Corleen Otwell G 11/20/2016, 10:41 AM

## 2016-11-20 NOTE — Progress Notes (Signed)
Physical Therapy Session Note  Patient Details  Name: [name] MRN: [id number] Date of Birth: [birth year]  Today's Date: 11/20/2016 PT Individual Time: 9458-5929 PT Individual Time Calculation (min): 23 min    Short Term Goals: Week 1:  PT Short Term Goal 1 (Week 1): Pt will perform bed mobility on flat bed with supervision PT Short Term Goal 2 (Week 1): Pt will perform bed <> chair transfers with min A and 25% cues for sequencing  PT Short Term Goal 3 (Week 1): Pt will perform w/c mobility x 50' with bilat UE propulsion for increased RUE attention, coordination. PT Short Term Goal 4 (Week 1): Pt will perform gait training x 75' with LRAD and min A PT Short Term Goal 5 (Week 1): Pt will negotiate 4 larger stairs with one UE support and min A  Skilled Therapeutic Interventions/Progress Updates:    Pt resting in w/c on arrival with no c/o pain and agreeable to therapy session.  Session focus on introduction to and ambulation with platform RW with min assist for sit<>stand and to don arm strap, overall min assist for gait with PRW x60' with increasing cues for R step length and balance when fatigued.  Pt easily frustrated throughout session and PT provided emotional support and encouragement as well as education regarding progress towards PT goals and stroke recovery.  Pt verbalized understanding, but education to continue as needed.  Pt left upright in w/c at end of session with call bell in reach and needs met.   Therapy Documentation Precautions:  Precautions Precautions: Fall Precaution Comments: Absent R sided proprioception, severe OA R knee-don knee support brace Required Braces or Orthoses: Sling (for R UE support when OOB) Restrictions Weight Bearing Restrictions: No   See Function Navigator for Current Functional Status.   Therapy/Group: Individual Therapy  Earnest Conroy Penven-Crew 11/20/2016, 4:36 PM

## 2016-11-20 NOTE — Progress Notes (Signed)
Subjective/Complaints: Patient feels like he cannot control the right upper or right lower limb. He has no pain on his right side, but feels like his sensation to touch movement and temperature are all reduced, does not feel weak on the right side  Review of systems no chest pain, no shortness of breath, no bowel or bladder issues  Objective: Vital Signs: Blood pressure (!) 145/78, pulse 70, temperature 98.4 F (36.9 C), temperature source Oral, resp. rate 18, SpO2 98 %. No results found. Results for orders placed or performed during the hospital encounter of 11/17/16 (from the past 72 hour(s))  Glucose, capillary     Status: None   Collection Time: 11/17/16  5:18 PM  Result Value Ref Range   Glucose-Capillary 89 65 - 99 mg/dL   Comment 1 Notify RN   Glucose, capillary     Status: Abnormal   Collection Time: 11/17/16  9:10 PM  Result Value Ref Range   Glucose-Capillary 173 (H) 65 - 99 mg/dL  Comprehensive metabolic panel     Status: Abnormal   Collection Time: 11/18/16  4:12 AM  Result Value Ref Range   Sodium 135 135 - 145 mmol/L   Potassium 4.0 3.5 - 5.1 mmol/L   Chloride 101 101 - 111 mmol/L   CO2 22 22 - 32 mmol/L   Glucose, Bld 95 65 - 99 mg/dL   BUN 20 6 - 20 mg/dL   Creatinine, Ser 0.89 0.61 - 1.24 mg/dL   Calcium 9.1 8.9 - 10.3 mg/dL   Total Protein 6.5 6.5 - 8.1 g/dL   Albumin 3.4 (L) 3.5 - 5.0 g/dL   AST 25 15 - 41 U/L   ALT 34 17 - 63 U/L   Alkaline Phosphatase 44 38 - 126 U/L   Total Bilirubin 0.8 0.3 - 1.2 mg/dL   GFR calc non Af Amer >60 >60 mL/min   GFR calc Af Amer >60 >60 mL/min    Comment: (NOTE) The eGFR has been calculated using the CKD EPI equation. This calculation has not been validated in all clinical situations. eGFR's persistently <60 mL/min signify possible Chronic Kidney Disease.    Anion gap 12 5 - 15  CBC WITH DIFFERENTIAL     Status: None   Collection Time: 11/18/16  4:12 AM  Result Value Ref Range   WBC 9.2 4.0 - 10.5 K/uL   RBC  4.89 4.22 - 5.81 MIL/uL   Hemoglobin 14.7 13.0 - 17.0 g/dL   HCT 44.0 39.0 - 52.0 %   MCV 90.0 78.0 - 100.0 fL   MCH 30.1 26.0 - 34.0 pg   MCHC 33.4 30.0 - 36.0 g/dL   RDW 12.6 11.5 - 15.5 %   Platelets 284 150 - 400 K/uL   Neutrophils Relative % 63 %   Neutro Abs 5.8 1.7 - 7.7 K/uL   Lymphocytes Relative 24 %   Lymphs Abs 2.2 0.7 - 4.0 K/uL   Monocytes Relative 11 %   Monocytes Absolute 1.0 0.1 - 1.0 K/uL   Eosinophils Relative 1 %   Eosinophils Absolute 0.1 0.0 - 0.7 K/uL   Basophils Relative 1 %   Basophils Absolute 0.1 0.0 - 0.1 K/uL  Glucose, capillary     Status: Abnormal   Collection Time: 11/18/16  6:31 AM  Result Value Ref Range   Glucose-Capillary 126 (H) 65 - 99 mg/dL  Glucose, capillary     Status: Abnormal   Collection Time: 11/18/16 12:06 PM  Result Value Ref Range  Glucose-Capillary 161 (H) 65 - 99 mg/dL  Glucose, capillary     Status: None   Collection Time: 11/18/16  5:01 PM  Result Value Ref Range   Glucose-Capillary 94 65 - 99 mg/dL  Glucose, capillary     Status: Abnormal   Collection Time: 11/18/16  8:53 PM  Result Value Ref Range   Glucose-Capillary 122 (H) 65 - 99 mg/dL  Glucose, capillary     Status: Abnormal   Collection Time: 11/19/16  6:48 AM  Result Value Ref Range   Glucose-Capillary 112 (H) 65 - 99 mg/dL  Glucose, capillary     Status: Abnormal   Collection Time: 11/19/16  1:28 PM  Result Value Ref Range   Glucose-Capillary 206 (H) 65 - 99 mg/dL   Comment 1 Document in Chart   Glucose, capillary     Status: Abnormal   Collection Time: 11/19/16  4:51 PM  Result Value Ref Range   Glucose-Capillary 118 (H) 65 - 99 mg/dL  Glucose, capillary     Status: Abnormal   Collection Time: 11/19/16  8:50 PM  Result Value Ref Range   Glucose-Capillary 113 (H) 65 - 99 mg/dL  Glucose, capillary     Status: Abnormal   Collection Time: 11/20/16  6:55 AM  Result Value Ref Range   Glucose-Capillary 113 (H) 65 - 99 mg/dL     General: No acute  distress Mood and affect are appropriate Heart: Regular rate and rhythm no rubs murmurs or extra sounds Lungs: Clear to auscultation, breathing unlabored, no rales or wheezes Abdomen: Positive bowel sounds, soft nontender to palpation, nondistended Extremities: No clubbing, cyanosis, or edema Skin: No evidence of breakdown, no evidence of rash Neurologic: Cranial nerves II through XII intact, motor strength is 5/5 in bilateral deltoid, bicep, tricep, grip, hip flexor, knee extensors, ankle dorsiflexor and plantar flexor Sensory exam normal sensation to light touch and proprioception in left upper and lower extremities, reduced in all modalities on the right side, includes face Cerebellar exam normal finger to nose to finger as well as heel to shin in left upper and lower extremities Dysmetria noted. Right upper and right lower Musculoskeletal: Full range of motion in all 4 extremities. No joint swelling   Assessment/Plan: 1. 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 Physiatrist is providing close team supervision and 24 hour management of active medical problems listed below. Physiatrist and rehab team continue to assess barriers to discharge/monitor patient progress toward functional and medical goals. FIM: Function - Bathing Position: Shower Body parts bathed by patient: Right arm, Chest, Abdomen, Front perineal area, Right upper leg, Left upper leg Body parts bathed by helper: Left arm, Buttocks, Right lower leg, Left lower leg, Back Assist Level:  (Mod A)  Function- Upper Body Dressing/Undressing What is the patient wearing?: Pull over shirt/dress Pull over shirt/dress - Perfomed by patient: Thread/unthread right sleeve, Thread/unthread left sleeve, Put head through opening, Pull shirt over trunk Assist Level: Supervision or verbal  cues Function - Lower Body Dressing/Undressing What is the patient wearing?: Underwear, Pants, Socks, Shoes Position: Wheelchair/chair at sink Underwear - Performed by helper: Thread/unthread right underwear leg, Pull underwear up/down, Thread/unthread left underwear leg Pants- Performed by helper: Thread/unthread right pants leg, Thread/unthread left pants leg, Pull pants up/down Socks - Performed by helper: Don/doff right sock, Don/doff left sock Shoes - Performed by helper: Don/doff right shoe, Don/doff left shoe, Fasten  right, Fasten left Assist for footwear: Dependant Assist for lower body dressing:  (Total A)  Function - Toileting Toileting steps completed by helper: Adjust clothing prior to toileting, Performs perineal hygiene, Adjust clothing after toileting Assist level:  (Max A)  Function - Toilet Transfers Toilet transfer assistive device: Elevated toilet seat/BSC over toilet, Grab bar Assist level to toilet: Moderate assist (Pt 50 - 74%/lift or lower) Assist level from toilet: Moderate assist (Pt 50 - 74%/lift or lower)  Function - Chair/bed transfer Chair/bed transfer method: Stand pivot Chair/bed transfer assist level: Maximal assist (Pt 25 - 49%/lift and lower) Chair/bed transfer assistive device: Armrests Chair/bed transfer details: Manual facilitation for weight shifting, Verbal cues for technique, Verbal cues for sequencing, Verbal cues for precautions/safety, Tactile cues for placement, Manual facilitation for weight bearing  Function - Locomotion: Wheelchair Type: Manual Max wheelchair distance: 10 Assist Level: Total assistance (Pt < 25%) Assist Level: Total assistance (Pt < 25%) Assist Level: Total assistance (Pt < 25%) Turns around,maneuvers to table,bed, and toilet,negotiates 3% grade,maneuvers on rugs and over doorsills: No Function - Locomotion: Ambulation Assistive device: Walker-hemi Max distance: 21 Assist level: Moderate assist (Pt 50 - 74%) Assist  level: Moderate assist (Pt 50 - 74%) Walk 50 feet with 2 turns activity did not occur: Safety/medical concerns Walk 150 feet activity did not occur: Safety/medical concerns Walk 10 feet on uneven surfaces activity did not occur: Safety/medical concerns  Function - Comprehension Comprehension: Auditory Comprehension assist level: Follows complex conversation/direction with extra time/assistive device  Function - Expression Expression: Verbal Expression assist level: Expresses basic needs/ideas: With extra time/assistive device  Function - Social Interaction Social Interaction assist level: Interacts appropriately with others - No medications needed.  Function - Problem Solving Problem solving assist level: Solves complex problems: With extra time  Function - Memory Memory assist level: More than reasonable amount of time Patient normally able to recall (first 3 days only): Current season, Location of own room, Staff names and faces, That he or she is in a hospital  Medical Problem List and Plan: 1.  Functional and mobility deficits secondary to left thalamic hemorrhage             -continue CIR PT, OT, speech 2.  DVT Prophylaxis/Anticoagulation: Pharmaceutical: Lovenox 3. Pain Management:  Will add tramadol prn for knee pain/shoulder pain. Monitor for symptoms as activity levels improve.  4. Mood: Team to provide ego support. Under lot of stress due to business commitments/end of year. LCSW to follow for evaluation and support.  5. Neuropsych: This patient is capable of making decisions on his own behalf. 6. Skin/Wound Care: Routine pressure relief measures. Maintain adequate nutritional and hydration status.  7. Fluids/Electrolytes/Nutrition: Monitor I/O. Check lytes in am. 8. HTN: Monitor BP bid--on HCTZ and cozaar. Monitor renal status.  9. 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 Add  Voltaren gel qid. 10 Bilateral Knee  OA: Add Voltaren gel.              -consider supportive/OA knee brace for support  11. Prediabetes: Hgb A1c- 6.3. Will add CM restrictions. Consult RD to educate patient on diet.  12. MRI demonstrating left thalamic hemorrhage, also involving corona radiata, no additional areas of infarct noted LOS (Days) 3 A FACE TO FACE EVALUATION WAS PERFORMED  KIRSTEINS,ANDREW E 11/20/2016, 10:35 AM

## 2016-11-21 ENCOUNTER — Inpatient Hospital Stay (HOSPITAL_COMMUNITY): Payer: PPO | Admitting: Physical Therapy

## 2016-11-21 ENCOUNTER — Inpatient Hospital Stay (HOSPITAL_COMMUNITY): Payer: PPO | Admitting: Speech Pathology

## 2016-11-21 ENCOUNTER — Inpatient Hospital Stay (HOSPITAL_COMMUNITY): Payer: PPO | Admitting: Occupational Therapy

## 2016-11-21 DIAGNOSIS — G8191 Hemiplegia, unspecified affecting right dominant side: Secondary | ICD-10-CM

## 2016-11-21 LAB — GLUCOSE, CAPILLARY
GLUCOSE-CAPILLARY: 126 mg/dL — AB (ref 65–99)
GLUCOSE-CAPILLARY: 122 mg/dL — AB (ref 65–99)
GLUCOSE-CAPILLARY: 125 mg/dL — AB (ref 65–99)
GLUCOSE-CAPILLARY: 122 mg/dL — AB (ref 65–99)

## 2016-11-21 NOTE — Progress Notes (Signed)
Subjective/Complaints:   Review of systems no chest pain, no shortness of breath, no bowel or bladder issues  Objective: Vital Signs: Blood pressure (!) 155/80, pulse 73, temperature 98.7 F (37.1 C), temperature source Oral, resp. rate 18, weight 104.3 kg (230 lb), SpO2 96 %. No results found. Results for orders placed or performed during the hospital encounter of 11/17/16 (from the past 72 hour(s))  Glucose, capillary     Status: Abnormal   Collection Time: 11/18/16 12:06 PM  Result Value Ref Range   Glucose-Capillary 161 (H) 65 - 99 mg/dL  Glucose, capillary     Status: None   Collection Time: 11/18/16  5:01 PM  Result Value Ref Range   Glucose-Capillary 94 65 - 99 mg/dL  Glucose, capillary     Status: Abnormal   Collection Time: 11/18/16  8:53 PM  Result Value Ref Range   Glucose-Capillary 122 (H) 65 - 99 mg/dL  Glucose, capillary     Status: Abnormal   Collection Time: 11/19/16  6:48 AM  Result Value Ref Range   Glucose-Capillary 112 (H) 65 - 99 mg/dL  Glucose, capillary     Status: Abnormal   Collection Time: 11/19/16  1:28 PM  Result Value Ref Range   Glucose-Capillary 206 (H) 65 - 99 mg/dL   Comment 1 Document in Chart   Glucose, capillary     Status: Abnormal   Collection Time: 11/19/16  4:51 PM  Result Value Ref Range   Glucose-Capillary 118 (H) 65 - 99 mg/dL  Glucose, capillary     Status: Abnormal   Collection Time: 11/19/16  8:50 PM  Result Value Ref Range   Glucose-Capillary 113 (H) 65 - 99 mg/dL  Glucose, capillary     Status: Abnormal   Collection Time: 11/20/16  6:55 AM  Result Value Ref Range   Glucose-Capillary 113 (H) 65 - 99 mg/dL  Glucose, capillary     Status: Abnormal   Collection Time: 11/20/16 11:44 AM  Result Value Ref Range   Glucose-Capillary 118 (H) 65 - 99 mg/dL  Glucose, capillary     Status: Abnormal   Collection Time: 11/20/16  4:28 PM  Result Value Ref Range   Glucose-Capillary 101 (H) 65 - 99 mg/dL  Glucose, capillary      Status: Abnormal   Collection Time: 11/20/16  8:49 PM  Result Value Ref Range   Glucose-Capillary 122 (H) 65 - 99 mg/dL  Glucose, capillary     Status: Abnormal   Collection Time: 11/21/16  6:52 AM  Result Value Ref Range   Glucose-Capillary 126 (H) 65 - 99 mg/dL     General: No acute distress Mood and affect are appropriate Heart: Regular rate and rhythm no rubs murmurs or extra sounds Lungs: Clear to auscultation, breathing unlabored, no rales or wheezes Abdomen: Positive bowel sounds, soft nontender to palpation, nondistended Extremities: No clubbing, cyanosis, or edema Skin: No evidence of breakdown, no evidence of rash Neurologic: Cranial nerves II through XII intact, motor strength is 5/5 in bilateral deltoid, bicep, tricep, grip, hip flexor, knee extensors, ankle dorsiflexor and plantar flexor Sensory exam normal sensation to light touch and proprioception in left upper and lower extremities, reduced in all modalities on the right side, includes face Cerebellar exam normal finger to nose to finger as well as heel to shin in left upper and lower extremities Dysmetria noted. Right upper and right lower Musculoskeletal: Full range of motion in all 4 extremities. No joint swelling   Assessment/Plan: 1. 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 Physiatrist is providing close team supervision and 24 hour management of active medical problems listed below. Physiatrist and rehab team continue to assess barriers to discharge/monitor patient progress toward functional and medical goals. FIM: Function - Bathing Position: Shower Body parts bathed by patient: Right arm, Left arm, Chest, Abdomen, Front perineal area, Right upper leg, Left upper leg Body parts bathed by helper: Buttocks, Back Bathing not applicable: Right lower leg, Left lower  leg Assist Level:  (Mod assist)  Function- Upper Body Dressing/Undressing What is the patient wearing?: Pull over shirt/dress Pull over shirt/dress - Perfomed by patient: Thread/unthread right sleeve, Thread/unthread left sleeve, Put head through opening, Pull shirt over trunk Assist Level: Supervision or verbal cues Function - Lower Body Dressing/Undressing What is the patient wearing?: Underwear, Pants, Socks, Shoes Position: Wheelchair/chair at [employer] Underwear - Performed by patient: Thread/unthread right underwear leg, Thread/unthread left underwear leg Underwear - Performed by helper: Pull underwear up/down Pants- Performed by patient: Thread/unthread right pants leg, Thread/unthread left pants leg Pants- Performed by helper: Pull pants up/down Socks - Performed by patient: Don/doff left sock Socks - Performed by helper: Don/doff right sock Shoes - Performed by patient: Don/doff left shoe Shoes - Performed by helper: Fasten right, Fasten left, Don/doff right shoe Assist for footwear: Maximal assist Assist for lower body dressing:  (Mod assist)  Function - Toileting Toileting steps completed by helper: Adjust clothing prior to toileting, Performs perineal hygiene, Adjust clothing after toileting Assist level:  (Max A)  Function - Toilet Transfers Toilet transfer assistive device: Elevated toilet seat/BSC over toilet, Grab bar Assist level to toilet: Moderate assist (Pt 50 - 74%/lift or lower) Assist level from toilet: Moderate assist (Pt 50 - 74%/lift or lower)  Function - Chair/bed transfer Chair/bed transfer method: Ambulatory Chair/bed transfer assist level: Touching or steadying assistance (Pt > 75%) Chair/bed transfer assistive device: Armrests, Walker Chair/bed transfer details: Manual facilitation for weight shifting, Verbal cues for technique, Verbal cues for sequencing, Verbal cues for precautions/safety, Tactile cues for placement, Manual facilitation for weight  bearing  Function - Locomotion: Wheelchair Type: Manual Max wheelchair distance: 50 Assist Level: Supervision or verbal cues Assist Level: Supervision or verbal cues Assist Level: Total assistance (Pt < 25%) Turns around,maneuvers to table,bed, and toilet,negotiates 3% grade,maneuvers on rugs and over doorsills: No Function - Locomotion: Ambulation Assistive device: Walker-platform Max distance: 60 Assist level: Touching or steadying assistance (Pt > 75%) Assist level: Touching or steadying assistance (Pt > 75%) Walk 50 feet with 2 turns activity did not occur: Safety/medical concerns Assist level: Touching or steadying assistance (Pt > 75%) Walk 150 feet activity did not occur: Safety/medical concerns Walk 10 feet on uneven surfaces activity did not occur: Safety/medical concerns  Function - Comprehension Comprehension: Auditory Comprehension assist level: Follows complex conversation/direction with extra time/assistive device  Function - Expression Expression: Verbal Expression assist level: Expresses basic needs/ideas: With extra time/assistive device  Function - Social Interaction Social Interaction assist level: Interacts appropriately with others - No medications needed.  Function - Problem Solving Problem solving assist level: Solves complex problems: With extra time  Function - Memory Memory assist level: More than reasonable amount of time Patient normally able to recall (first 3 days only): Current season, Location of own room, Staff names and faces, That he or she is in a hospital  Medical Problem List and Plan: 1.  Functional and mobility deficits secondary to left thalamic hemorrhage             -continue CIR PT, OT, speech, team conf in am 2.  DVT Prophylaxis/Anticoagulation: Pharmaceutical: Lovenox 3. Pain Management:  Will add tramadol prn for knee pain/shoulder pain. Monitor for symptoms as activity levels improve.  4. Mood: Team to provide ego support.  Under lot of stress due to business commitments/end of year. LCSW to follow for evaluation and support.  5. Neuropsych: This patient is capable of making decisions on his own behalf. 6. Skin/Wound Care: Routine pressure relief measures. Maintain adequate nutritional and hydration status.  7. Fluids/Electrolytes/Nutrition: Monitor I/O. Fluids ~57800ml per day, ~75% meals, need to enc fluids 8. HTN: Monitor BP bid--on HCTZ and cozaar. Monitor renal status.  BMP Latest Ref Rng & Units 11/18/2016  Glucose 65 - 99 mg/dL 95  BUN 6 - 20 mg/dL 20  Creatinine 0.980.61 - 1.191.24 mg/dL 1.470.89  Sodium 829135 - 562145 mmol/L 135  Potassium 3.5 - 5.1 mmol/L 4.0  Chloride 101 - 111 mmol/L 101  CO2 22 - 32 mmol/L 22  Calcium 8.9 - 10.3 mg/dL 9.1   9. 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 Add  Voltaren gel qid. 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              -consider supportive/OA knee brace for support  11. Prediabetes: Hgb A1c- 6.3. Will add CM restrictions. Consult RD to educate patient on diet.  12. MRI demonstrating left thalamic hemorrhage, also involving corona radiata, no additional areas of infarct noted LOS (Days) 4 A FACE TO FACE EVALUATION WAS PERFORMED  KIRSTEINS,ANDREW E 11/21/2016, 9:31 AM

## 2016-11-21 NOTE — Progress Notes (Signed)
Speech Language Pathology Daily Session Note  Patient Details  Name: Justin FillerStephen [name] MRN: [id number] Date of Birth: 02/20/1951  Today's Date: 11/21/2016 SLP Individual Time: 1300-1330 SLP Individual Time Calculation (min): 30 min   Short Term Goals: Week 1: SLP Short Term Goal 1 (Week 1): Pt will utilize overarticulation to achieve articulatory precision in conversations with mod I.  SLP Short Term Goal 2 (Week 1): Pt will complete semi-complex home management tasks with mod I functional problem solving.   SLP Short Term Goal 3 (Week 1): Pt will consume regular textures with mod I use of swallowing precautions over 2 consecutive sessions prior to advancement.    Skilled Therapeutic Interventions:  Pt was seen for skilled ST targeting goals for dysphagia.  SLP attempted to facilitate the session with a trial meal tray of regular textures; however, all textures delivered from the kitchen on pt's meal tray were dys 3.  Pt consumed his lunch meal with mod I use of swallowing precautions and no difficulty clearing solids from the oral cavity.  No overt s/s of aspiration were evident with solids or liquids.  Despite not having observed pt with regular textures during meal, previous observations with advanced textures and pt's intact safety awareness for PO intake are relatively good indicators of pt's ability to tolerate advanced solids.  Therefore, recommend advancing pt's diet to regular, thins.  SLP will follow up for toleration.  Pt was left in wheelchair with call bell within reach.  Continue per current plan of care.    Function:  Eating Eating   Modified Consistency Diet: No Eating Assist Level: Swallowing techniques: self managed           Cognition Comprehension Comprehension assist level: Follows complex conversation/direction with extra time/assistive device  Expression   Expression assist level: Expresses basic needs/ideas: With extra time/assistive device  Social Interaction  Social Interaction assist level: Interacts appropriately with others - No medications needed.  Problem Solving Problem solving assist level: Solves complex problems: With extra time  Memory Memory assist level: More than reasonable amount of time    Pain Pain Assessment Pain Assessment: No/denies pain  Therapy/Group: Individual Therapy  Michelena Culmer, Melanee SpryNicole L 11/21/2016, 3:57 PM

## 2016-11-21 NOTE — Progress Notes (Signed)
Occupational Therapy Session Note  Patient Details  Name: [name] MRN: [id number] Date of Birth: 08/09/1951  Today's Date: 11/21/2016 OT Individual Time: 0930-1030 and 1133-1203 OT Individual Time Calculation (min): 60 min and 30 min    Short Term Goals: Week 1:  OT Short Term Goal 1 (Week 1): Pt will complete toilet transfers and LRAD with Min A OT Short Term Goal 2 (Week 1): Pt will complete LB dressing with Max A OT Short Term Goal 3 (Week 1): Pt will complete bathing with Min A OT Short Term Goal 4 (Week 1): Pt will complete shower transfer with Min A and LRAD OT Short Term Goal 5 (Week 1): Pt will utilize R UE during ADL routine with min multimodal cues  Skilled Therapeutic Interventions/Progress Updates:    1) Treatment session with focus on RUE NMR.  Pt received in bed declining bathing and dressing this session, requesting to focus on RUE.  Pt completed bed mobility with supervision and stand pivot transfer to w/c with min assist.  Engaged in RUE NMR with focus on motor control with reaching to obtain cups with focus on unstacking and then stacking cups, pt with increased difficulty with picking up cup to return to stack due to increased need for precision and grading of grasp pressure when picking up single cup.  Increased challenge to placing numbered dots for pt to correlate cups to dots to increase motor control and precise placement of cups. Pt demonstrating difficulty and knocking items over when not visually attending to task. Pt would place items and then when turning to pick up next item would often knock an item over due to decreased proprioception.  Modified challenge to picking up rings and then picking nuts and bolts out of container.  Cues to visually attend to task to increase success and control of movements.  2) Treatment session with focus on functional reaching with RUE.  Engaged in Dynavision activity in sitting with focus on gross motor movements and reaction  time with RUE.  Pt completed 3 trials with progression from 12, to 24, to 20 in 60 seconds.  Discussed implications of reaction time into functional tasks.  Noted overshooting > 50% of reaching activity.  Returned to room and left upright in w/c awaiting lunch tray.  Therapy Documentation Precautions:  Precautions Precautions: Fall Precaution Comments: Absent R sided proprioception, severe OA R knee-don knee support brace Required Braces or Orthoses: Sling (for R UE support when OOB) Restrictions Weight Bearing Restrictions: No General:   Vital Signs: Therapy Vitals Temp: 98.9 F (37.2 C) Temp Source: Oral Pulse Rate: 72 Resp: 18 BP: 120/62 Patient Position (if appropriate): Sitting Oxygen Therapy SpO2: 96 % O2 Device: Not Delivered Pain:  Pt with no c/o pain  See Function Navigator for Current Functional Status.   Therapy/Group: Individual Therapy  Rosalio LoudHOXIE, Deretha Ertle 11/21/2016, 3:47 PM

## 2016-11-21 NOTE — Progress Notes (Signed)
Physical Therapy Session Note  Patient Details  Name: [name] MRN: [id number] Date of Birth: [birth year]  Today's Date: 11/21/2016 PT Individual Time: 1420-1530 PT Individual Time Calculation (min): 70 min    Short Term Goals: Week 1:  PT Short Term Goal 1 (Week 1): Pt will perform bed mobility on flat bed with supervision PT Short Term Goal 2 (Week 1): Pt will perform bed <> chair transfers with min A and 25% cues for sequencing  PT Short Term Goal 3 (Week 1): Pt will perform w/c mobility x 50' with bilat UE propulsion for increased RUE attention, coordination. PT Short Term Goal 4 (Week 1): Pt will perform gait training x 75' with LRAD and min A PT Short Term Goal 5 (Week 1): Pt will negotiate 4 larger stairs with one UE support and min A  Skilled Therapeutic Interventions/Progress Updates:    Pt resting in w/c on arrival, no c/o pain, and agreeable to therapy session.  Session focus on w/c propulsion with L hemi-technique x100' with overall supervision and min verbal cues for efficiency, postural control, ambulation, and NMR.    Pt transfers with squat/pivot w/c>therapy mat with close supervision but requires steady assist and min verbal cues for attention to RLE with ambulatory transfers.  Gait training 2x60' +100' with PRW and overall steady assist with intermittent bouts of close supervision.  NMR via verbal and tactile cues for R knee flexion during swing phase and verbal cues for heel strike and knee control during gait.    NMR in sitting on dynadisk focus on pelvis/trunk alignment, trunk elongation/shortenting, and trunk rotation during weight shift activity and reaching task.  NMR for shoulders x15 reps seated rows with multimodal cues for RUE positioning and correct form.  NMR in sitting with forced weight bearing through RUE while reaching across midline for cups.  RUE NMR reaching for cups and placing on mat focus on gross grasp, motor control, force, speed, timing, and  coordination.    Pt returned to room at end of session and positioned upright in w/c with call bell in reach and needs met.   Therapy Documentation Precautions:  Precautions Precautions: Fall Precaution Comments: Absent R sided proprioception, severe OA R knee-don knee support brace Required Braces or Orthoses: Sling (for R UE support when OOB) Restrictions Weight Bearing Restrictions: No   See Function Navigator for Current Functional Status.   Therapy/Group: Individual Therapy  Earnest Conroy Penven-Crew 11/21/2016, 4:38 PM

## 2016-11-22 ENCOUNTER — Inpatient Hospital Stay (HOSPITAL_COMMUNITY): Payer: PPO

## 2016-11-22 ENCOUNTER — Inpatient Hospital Stay (HOSPITAL_COMMUNITY): Payer: PPO | Admitting: Speech Pathology

## 2016-11-22 ENCOUNTER — Inpatient Hospital Stay (HOSPITAL_COMMUNITY): Payer: PPO | Admitting: Occupational Therapy

## 2016-11-22 ENCOUNTER — Inpatient Hospital Stay (HOSPITAL_COMMUNITY): Payer: PPO | Admitting: *Deleted

## 2016-11-22 ENCOUNTER — Inpatient Hospital Stay (HOSPITAL_COMMUNITY): Payer: PPO | Admitting: Physical Therapy

## 2016-11-22 DIAGNOSIS — M25511 Pain in right shoulder: Secondary | ICD-10-CM

## 2016-11-22 DIAGNOSIS — M170 Bilateral primary osteoarthritis of knee: Secondary | ICD-10-CM

## 2016-11-22 DIAGNOSIS — G8929 Other chronic pain: Secondary | ICD-10-CM

## 2016-11-22 LAB — GLUCOSE, CAPILLARY
GLUCOSE-CAPILLARY: 136 mg/dL — AB (ref 65–99)
GLUCOSE-CAPILLARY: 101 mg/dL — AB (ref 65–99)
Glucose-Capillary: 100 mg/dL — ABNORMAL HIGH (ref 65–99)
Glucose-Capillary: 197 mg/dL — ABNORMAL HIGH (ref 65–99)

## 2016-11-22 NOTE — Progress Notes (Signed)
Occupational Therapy Session Note  Patient Details  Name: [name] MRN: [id number] Date of Birth: 10/07/1951  Today's Date: 11/22/2016 OT Individual Time: 0930-1030 and 1345-1415 OT Individual Time Calculation (min): 60 min and 30 min    Short Term Goals: Week 1:  OT Short Term Goal 1 (Week 1): Pt will complete toilet transfers and LRAD with Min A OT Short Term Goal 2 (Week 1): Pt will complete LB dressing with Max A OT Short Term Goal 3 (Week 1): Pt will complete bathing with Min A OT Short Term Goal 4 (Week 1): Pt will complete shower transfer with Min A and LRAD OT Short Term Goal 5 (Week 1): Pt will utilize R UE during ADL routine with min multimodal cues  Skilled Therapeutic Interventions/Progress Updates:    1) Treatment session with focus on functional transfers, sit <> stand, and increased participation in self-care tasks.  Pt received seated at EOB, completed stand pivot transfer with min/steady assist.  Stand pivot transfer into room shower with use of grab bars and min/steady assist to control pivot.  Pt completed bathing at sit to partial stand with use of grab bars to lift up into partial standing to attempt to wash buttocks, however pt not successful due to decreased upright stance in shower.  Completed dressing at sit > stand level at sink with cues to reach behind self with Lt hand to pull underwear over buttocks to compensate for decreased grasp in Rt hand.  Educated on use of vision to increase success with grasp with Rt hand, continues to be complicated secondary to body habitus.  Pt completed grooming tasks in sitting, requiring assist to open containers.  Pt required assist to don socks and fasten shoes due to body habitus and decreased ability to reach feet.  Discussed shoulder positioning to decrease pain, as pt reports pain in internal rotation and when wearing sling.  2) Treatment session with focus on RUE NMR.  Engaged in card sorting task in sitting with focus  on fine motor control with flipping over cards and discarding cards from Lt hand.  Pt initially utilized LUE, requiring cues to attempt with Rt hand.  Transitioned from passing cards from Lt to Rt to flipping cards with Rt hand.  Pt continues to demonstrate difficulty with "follow through" of activity as he will get card to destination but then often knocking over entire pile or dragging Rt hand through pile when returning to start of activity.  Educated on visually attending to RUE to increase motor control and termination of task.  Discussed recall of previous strategies and implementation into functional tasks including self-feeding.  Pt verbalized understanding.  Therapy Documentation Precautions:  Precautions Precautions: Fall Precaution Comments: Absent R sided proprioception, severe OA R knee-don knee support brace Required Braces or Orthoses: Sling (for R UE support when OOB) Restrictions Weight Bearing Restrictions: No Pain:  Pt reports pain in Rt shoulder with internal rotation.  RN aware  See Function Navigator for Current Functional Status.   Therapy/Group: Individual Therapy  Rosalio LoudHOXIE, Kendon Sedeno 11/22/2016, 11:29 AM

## 2016-11-22 NOTE — Progress Notes (Signed)
Subjective/Complaints:  Patient has right shoulder pain. Prior history of shoulder pain starting around 2 weeks prior to stroke. No history of shoulder surgery. No history of trauma or falls. Pain is mainly when he moves his arm in a certain direction. Since stroke. He is had abnormal sensation in the right upper and lower limb Review of systems no chest pain, no shortness of breath, no bowel or bladder issues  Objective: Vital Signs: Blood pressure 140/83, pulse 75, temperature 98.7 F (37.1 C), temperature source Oral, resp. rate 18, weight 104.3 kg (230 lb), SpO2 95 %. No results found. Results for orders placed or performed during the hospital encounter of 11/17/16 (from the past 72 hour(s))  Glucose, capillary     Status: Abnormal   Collection Time: 11/19/16  1:28 PM  Result Value Ref Range   Glucose-Capillary 206 (H) 65 - 99 mg/dL   Comment 1 Document in Chart   Glucose, capillary     Status: Abnormal   Collection Time: 11/19/16  4:51 PM  Result Value Ref Range   Glucose-Capillary 118 (H) 65 - 99 mg/dL  Glucose, capillary     Status: Abnormal   Collection Time: 11/19/16  8:50 PM  Result Value Ref Range   Glucose-Capillary 113 (H) 65 - 99 mg/dL  Glucose, capillary     Status: Abnormal   Collection Time: 11/20/16  6:55 AM  Result Value Ref Range   Glucose-Capillary 113 (H) 65 - 99 mg/dL  Glucose, capillary     Status: Abnormal   Collection Time: 11/20/16 11:44 AM  Result Value Ref Range   Glucose-Capillary 118 (H) 65 - 99 mg/dL  Glucose, capillary     Status: Abnormal   Collection Time: 11/20/16  4:28 PM  Result Value Ref Range   Glucose-Capillary 101 (H) 65 - 99 mg/dL  Glucose, capillary     Status: Abnormal   Collection Time: 11/20/16  8:49 PM  Result Value Ref Range   Glucose-Capillary 122 (H) 65 - 99 mg/dL  Glucose, capillary     Status: Abnormal   Collection Time: 11/21/16  6:52 AM  Result Value Ref Range   Glucose-Capillary 126 (H) 65 - 99 mg/dL  Glucose,  capillary     Status: Abnormal   Collection Time: 11/21/16 11:19 AM  Result Value Ref Range   Glucose-Capillary 122 (H) 65 - 99 mg/dL  Glucose, capillary     Status: Abnormal   Collection Time: 11/21/16  4:18 PM  Result Value Ref Range   Glucose-Capillary 125 (H) 65 - 99 mg/dL  Glucose, capillary     Status: Abnormal   Collection Time: 11/21/16  8:58 PM  Result Value Ref Range   Glucose-Capillary 122 (H) 65 - 99 mg/dL  Glucose, capillary     Status: Abnormal   Collection Time: 11/22/16  6:44 AM  Result Value Ref Range   Glucose-Capillary 136 (H) 65 - 99 mg/dL     General: No acute distress Mood and affect are appropriate Heart: Regular rate and rhythm no rubs murmurs or extra sounds Lungs: Clear to auscultation, breathing unlabored, no rales or wheezes Abdomen: Positive bowel sounds, soft nontender to palpation, nondistended Extremities: No clubbing, cyanosis, or edema Skin: No evidence of breakdown, no evidence of rash Neurologic: Cranial nerves II through XII intact, motor strength is 5/5 in bilateral deltoid, bicep, tricep, grip, hip flexor, knee extensors, ankle dorsiflexor and plantar flexor Sensory exam normal sensation to light touch and proprioception in left upper and lower extremities, reduced in  all modalities on the right side, includes face Cerebellar exam normal finger to nose to finger as well as heel to shin in left upper and lower extremities Dysmetria noted. Right upper and right lower Musculoskeletal: Full range of motion in all 4 extremities. No joint swelling   Assessment/Plan: 1. 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 Physiatrist is providing close team supervision and 24 hour management of active medical problems listed below. Physiatrist and rehab team continue to assess barriers to discharge/monitor patient  progress toward functional and medical goals. FIM: Function - Bathing Position: Shower Body parts bathed by patient: Right arm, Left arm, Chest, Abdomen, Front perineal area, Right upper leg, Left upper leg Body parts bathed by helper: Buttocks, Back Bathing not applicable: Right lower leg, Left lower leg Assist Level:  (Mod assist)  Function- Upper Body Dressing/Undressing What is the patient wearing?: Pull over shirt/dress Pull over shirt/dress - Perfomed by patient: Thread/unthread right sleeve, Thread/unthread left sleeve, Put head through opening, Pull shirt over trunk Assist Level: Supervision or verbal cues Function - Lower Body Dressing/Undressing What is the patient wearing?: Underwear, Pants, Socks, Shoes Position: Wheelchair/chair at [employer] - Performed by patient: Thread/unthread right underwear leg, Thread/unthread left underwear leg Underwear - Performed by helper: Pull underwear up/down Pants- Performed by patient: Thread/unthread right pants leg, Thread/unthread left pants leg Pants- Performed by helper: Pull pants up/down Socks - Performed by patient: Don/doff left sock Socks - Performed by helper: Don/doff right sock Shoes - Performed by patient: Don/doff left shoe Shoes - Performed by helper: Fasten right, Fasten left, Don/doff right shoe Assist for footwear: Maximal assist Assist for lower body dressing:  (Mod assist)  Function - Toileting Toileting steps completed by helper: Adjust clothing prior to toileting, Performs perineal hygiene, Adjust clothing after toileting Assist level:  (Max A)  Function - Toilet Transfers Toilet transfer assistive device: Elevated toilet seat/BSC over toilet, Grab bar Assist level to toilet: Moderate assist (Pt 50 - 74%/lift or lower) Assist level from toilet: Moderate assist (Pt 50 - 74%/lift or lower)  Function - Chair/bed transfer Chair/bed transfer method: Squat pivot, Ambulatory Chair/bed transfer assist level:  Touching or steadying assistance (Pt > 75%) Chair/bed transfer assistive device: Armrests, Walker Chair/bed transfer details: Manual facilitation for weight shifting, Verbal cues for technique, Verbal cues for sequencing, Verbal cues for precautions/safety, Tactile cues for placement, Manual facilitation for weight bearing  Function - Locomotion: Wheelchair Type: Manual Max wheelchair distance: 150 Assist Level: Supervision or verbal cues Assist Level: Supervision or verbal cues Assist Level: Supervision or verbal cues Turns around,maneuvers to table,bed, and toilet,negotiates 3% grade,maneuvers on rugs and over doorsills: No Function - Locomotion: Ambulation Assistive device: Walker-platform Max distance: 100 Assist level: Touching or steadying assistance (Pt > 75%) Assist level: Touching or steadying assistance (Pt > 75%) Walk 50 feet with 2 turns activity did not occur: Safety/medical concerns Assist level: Touching or steadying assistance (Pt > 75%) Walk 150 feet activity did not occur: Safety/medical concerns Walk 10 feet on uneven surfaces activity did not occur: Safety/medical concerns  Function - Comprehension Comprehension: Auditory Comprehension assist level: Follows complex conversation/direction with extra time/assistive device  Function - Expression Expression: Verbal Expression assist level: Expresses basic needs/ideas: With extra time/assistive device  Function - Social Interaction Social Interaction assist level: Interacts appropriately with others - No medications needed.  Function - Problem Solving Problem solving assist level:  Solves complex problems: With extra time  Function - Memory Memory assist level: More than reasonable amount of time Patient normally able to recall (first 3 days only): Current season, Location of own room, Staff names and faces, That he or she is in a hospital  Medical Problem List and Plan: 1.  Functional and mobility deficits  secondary to left thalamic hemorrhage             -continue CIR PT, OT, speech, Team conference today please see physician documentation under team conference tab, met with team face-to-face to discuss problems,progress, and goals. Formulized individual treatment plan based on medical history, underlying problem and comorbidities. 2.  DVT Prophylaxis/Anticoagulation: Pharmaceutical: Lovenox 3. Pain Management:  Will add tramadol prn for knee pain/shoulder pain, discussed potential for steroid injection. He does not wish to proceed with this at the current time. We will ask PT, OT. To Trial FES 4. Mood: Team to provide ego support. Under lot of stress due to business commitments/end of year. LCSW to follow for evaluation and support.  5. Neuropsych: This patient is capable of making decisions on his own behalf. 6. Skin/Wound Care: Routine pressure relief measures. Maintain adequate nutritional and hydration status.  7. Fluids/Electrolytes/Nutrition: Monitor I/O. Fluids ~531m per day, ~75% meals, need to enc fluids 8. HTN: Monitor BP bid--on HCTZ and cozaar. Most recent BUN, creatinine normal, as below BMP Latest Ref Rng & Units 11/18/2016  Glucose 65 - 99 mg/dL 95  BUN 6 - 20 mg/dL 20  Creatinine 0.61 - 1.24 mg/dL 0.89  Sodium 135 - 145 mmol/L 135  Potassium 3.5 - 5.1 mmol/L 4.0  Chloride 101 - 111 mmol/L 101  CO2 22 - 32 mmol/L 22  Calcium 8.9 - 10.3 mg/dL 9.1   9. 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 Add  Voltaren gel qid.Check shoulder x-ray 10 Bilateral Knee OA: Add Voltaren gel.              -consider supportive/OA knee brace for support  11. Prediabetes: Hgb A1c- 6.3. Will add CM restrictions. Consult RD to educate patient on diet.  12. MRI demonstrating left thalamic hemorrhage, also involving corona radiata, no additional areas of infarct noted LOS (Days) 5 A FACE TO FACE EVALUATION WAS PERFORMED  KIRSTEINS,ANDREW  E 11/22/2016, 10:01 AM

## 2016-11-22 NOTE — Progress Notes (Signed)
Speech Language Pathology Daily Session Note  Patient Details  Name: Gay FillerStephen Stroupe MRN: [id number] Date of Birth: 10/27/1951  Today's Date: 11/22/2016 SLP Individual Time: 1255-1325 SLP Individual Time Calculation (min): 30 min   Short Term Goals: Week 1: SLP Short Term Goal 1 (Week 1): Pt will utilize overarticulation to achieve articulatory precision in conversations with mod I.  SLP Short Term Goal 2 (Week 1): Pt will complete semi-complex home management tasks with mod I functional problem solving.   SLP Short Term Goal 2 - Progress (Week 1): Discontinued (comment) (pt reports he is back to baseline for cognition ) SLP Short Term Goal 3 (Week 1): Pt will consume regular textures with mod I use of swallowing precautions over 2 consecutive sessions prior to advancement.    Skilled Therapeutic Interventions: Skilled treatment session focused on speech intelligibility goals. SLP facilitated session by providing intermittent verbal cues for use of a slow rate and over-articulation during a novel verbal descprition task at the sentence level and throughout a functional conversation to achieve 100% intelligibility. Recommend 1 more session to focus on utilization of speech intelligibility strategies. Patient left upright in wheelchair with all needs within reach. Continue with current plan of care.   Function:  Cognition Comprehension Comprehension assist level: Follows complex conversation/direction with extra time/assistive device  Expression   Expression assist level: Expresses basic 90% of the time/requires cueing < 10% of the time.  Social Interaction Social Interaction assist level: Interacts appropriately with others - No medications needed.  Problem Solving Problem solving assist level: Solves complex problems: With extra time  Memory Memory assist level: More than reasonable amount of time    Pain Pain Assessment Pain Assessment: No/denies pain  Therapy/Group: Individual  Therapy  Deveron Shamoon 11/22/2016, 3:36 PM

## 2016-11-22 NOTE — Evaluation (Addendum)
Recreational Therapy Assessment and Plan  Patient Details  Name: [name] MRN: [id number] Date of Birth: [birth year] Today's Date: 11/22/2016  Rehab Potential: Good ELOS: 10 days   Assessment Clinical Impression: Problem List:      Patient Active Problem List   Diagnosis Date Noted  . ICH (intracerebral hemorrhage) ([place]) 11/17/2016    Past Medical History:      Past Medical History:  Diagnosis Date  . HTN (hypertension)   . OA (osteoarthritis) of knee    endstage OA bilateral knees--was recently cleared for surgery  . Right leg swelling   . Right shoulder injury 2015   Past Surgical History:       Past Surgical History:  Procedure Laterality Date  . TONSILLECTOMY      Assessment & Plan Clinical Impression: Patient is a 65 year old RH-male in recent diagnosis of HTN (no MD follow up for 10+ years), endstage OA R>L knee who was admitted to [facility] 11/14/16 with right sided weakness and slurred speech. 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 Neurology recommended BP control for management of bleed. 2 D echo with definity revealed mild concentric LVH, mild left atrial dilatation, EF 60-65% and impaired relaxation. Therapy initiated and patient limited by right side weakness with sensory changes, dysarthria and mild dysphagia.  Patient transferred to CIR on 11/17/2016.   Pt presents with decreased activity tolerance, decreased functional mobility, decreased balance, decreased coordination, right inattention, deceased leisure awareness,  Limiting pt's independence with leisure/community pursuits.  Leisure History/Participation Premorbid leisure interest/current participation: Sports Wellsite geologist;Joretta Bachelor care;Nature - Other (Comment);Community - Doctor, hospital - Building control surveyor - Travel  (Comment) Other Leisure Interests: Television Leisure Participation Style: With Family/Friends;Alone Awareness of Community Resources: Fair-identify 2 post discharge leisure resources Psychosocial / Spiritual Spiritual Interests: Church;Womens'Men's Groups ([employer]- witnessing on weekends) Stress Management: Fair Methods of Stress Management: cant report Patient agreeable to Pet Therapy: Yes Does patient have pets?: Yes Social interaction - Mood/Behavior: Cooperative Academic librarian Appropriate for Education?: Yes Patient Agreeable to Outing?: Yes Recreational Therapy Orientation Orientation -Reviewed with patient: Available activity resources Strengths/Weaknesses Patient Strengths/Abilities: Willingness to participate;Active premorbidly Patient weaknesses: Physical limitations TR Patient demonstrates impairments in the following area(s): Endurance;Motor;Safety  Plan Rec Therapy Plan Is patient appropriate for Therapeutic Recreation?: Yes Rehab Potential: Good Treatment times per week: Min 1 time per week >20 minutes Estimated Length of Stay: 10 days TR Treatment/Interventions: Adaptive equipment instruction;1:1 session;Balance/vestibular training;Functional mobility training;Community reintegration;Cognitive remediation/compensation;Patient/family education;Therapeutic activities;Recreation/leisure participation;Therapeutic exercise;UE/LE Coordination activities;Wheelchair propulsion/positioning; Relaxation training  Recommendations for other services: NONE  Discharge Criteria: Patient will be discharged from TR if patient refuses treatment 3 consecutive times without medical reason.  If treatment goals not met, if there is a change in medical status, if patient makes no progress towards goals or if patient is discharged from hospital.  The above assessment, treatment plan, treatment alternatives and goals were discussed and mutually agreed upon: by  patient  [place] 11/22/2016, 3:45 PM

## 2016-11-22 NOTE — Progress Notes (Signed)
Physical Therapy Session Note  Patient Details  Name: [name] MRN: [id number] Date of Birth: 01/19/1951  Today's Date: 11/22/2016 PT Individual Time: 1530-1600 PT Individual Time Calculation (min): 30 min    Short Term Goals: Week 1:  PT Short Term Goal 1 (Week 1): Pt will perform bed mobility on flat bed with supervision PT Short Term Goal 2 (Week 1): Pt will perform bed <> chair transfers with min A and 25% cues for sequencing  PT Short Term Goal 3 (Week 1): Pt will perform w/c mobility x 50' with bilat UE propulsion for increased RUE attention, coordination. PT Short Term Goal 4 (Week 1): Pt will perform gait training x 75' with LRAD and min A PT Short Term Goal 5 (Week 1): Pt will negotiate 4 larger stairs with one UE support and min A  Skilled Therapeutic Interventions/Progress Updates:     Patient received supine in bed, being set up for transport for shoulder X-ray. PT will re-attempt at later time.   PT return after 30 minutes and [facility]  Transported to rehab gym by PT for time management. .   Gait x 17500ft with RW and min assist from PT. Min-mod cues for proper step length and improved knee flexion in swing phase to normalize gait pattern. Patient noted to have decreased foot clearance when distracted .   Alternating toe taps on 6 inch step x 10 BLE with cues for improved HS activation. Intermittent 1-2 UE support.   Stair training up down 4 steps 6 inches each. Mod assist progressing to min Assist. PT also provided mod cues for step to gait pattern leading with the Sound LE with ascent R LE with descent.   Therapy Documentation Precautions:  Precautions Precautions: Fall Precaution Comments: Absent R sided proprioception, severe OA R knee-don knee support brace Required Braces or Orthoses: Sling (for R UE support when OOB) Restrictions Weight Bearing Restrictions: No General: PT Amount of Missed Time (min): 30 Minutes PT  Missed Treatment Reason: Xray Vital Signs: Therapy Vitals Temp: 98.5 F (36.9 C) Temp Source: Oral Pulse Rate: 71 Resp: 18 BP: (!) 150/76 (RN notified) Patient Position (if appropriate): Lying Oxygen Therapy SpO2: 95 % O2 Device: Not Delivered Pain: 0/10  See Function Navigator for Current Functional Status.   Therapy/Group: Individual Therapy  [name] 11/22/2016, 5:38 PM

## 2016-11-22 NOTE — Progress Notes (Signed)
Speech Language Pathology Daily Session Note  Patient Details  Name: [name] MRN: [id number] Date of Birth: 06/26/1951  Today's Date: 11/22/2016 SLP Individual Time: 1140-1205 SLP Individual Time Calculation (min): 25 min   Short Term Goals: Week 1: SLP Short Term Goal 1 (Week 1): Pt will utilize overarticulation to achieve articulatory precision in conversations with mod I.  SLP Short Term Goal 2 (Week 1): Pt will complete semi-complex home management tasks with mod I functional problem solving.   SLP Short Term Goal 2 - Progress (Week 1): Discontinued (comment) (pt reports he is back to baseline for cognition ) SLP Short Term Goal 3 (Week 1): Pt will consume regular textures with mod I use of swallowing precautions over 2 consecutive sessions prior to advancement.    Skilled Therapeutic Interventions:  Pt was seen for skilled ST targeting dysphagia goals.  Pt consumed regular textures and thin liquids with his lunch meal with x1 supervision verbal cue to monitor and correct anterior labial loss of liquids.  Pt was otherwise mod I for use of swallowing precautions to clear solids from the oral cavity.  No overt s/s of aspiration were evident with solids or liquids. Pt at goal level for swallowing function.  Recommend 1-2 additional ST sessions to provide education regarding use of compensatory speech intelligibility strategies prior to d/c form SLP services.  Pt left in wheelchair with call bell within reach.  Continue per current plan of care.      Function:  Eating Eating   Modified Consistency Diet: No Eating Assist Level: Swallowing techniques: self managed           Cognition Comprehension Comprehension assist level: Follows complex conversation/direction with extra time/assistive device  Expression   Expression assist level: Expresses basic needs/ideas: With extra time/assistive device  Social Interaction Social Interaction assist level: Interacts appropriately with  others - No medications needed.  Problem Solving Problem solving assist level: Solves complex problems: With extra time  Memory Memory assist level: More than reasonable amount of time    Pain Pain Assessment Pain Assessment: No/denies pain  Therapy/Group: Individual Therapy  [name], Melanee SpryNicole L 11/22/2016, 12:20 PM

## 2016-11-22 NOTE — Progress Notes (Signed)
Social Work Patient ID: [name], male   DOB: 1951/06/23, 65 y.o.   MRN: [id number]  Met with pt to dsicuss team conference goals mod/i-supervision level and target discharge 1/4. He feels he is making progress As long as he can make his arm and leg do what he wants it to do. He feels very uncoordinated but is working on this. His diet was advanced yesterday and he feels good about this. He is willing to work hard to regain His independence before he leaves here.

## 2016-11-22 NOTE — Patient Care Conference (Signed)
Inpatient RehabilitationTeam Conference and Plan of Care Update Date: 11/22/2016   Time: 11:10 AM    Patient Name: [name]      Medical Record Number: [id number]  Date of Birth: 12/18/1950 Sex: Male         Room/Bed: 4W09C/4W09C-01 Payor Info: Payor: [employer] / Plan: [employer] / Product Type: *No Product type* /    Admitting Diagnosis: cva  Admit Date/Time:  11/17/2016 11:39 AM Admission Comments: No comment available   Primary Diagnosis:  <principal problem not specified> Principal Problem: <principal problem not specified>  Patient Active Problem List   Diagnosis Date Noted  . ICH (intracerebral hemorrhage) ([facility]) 11/17/2016    Expected Discharge Date: Expected Discharge Date: 12/07/16  Team Members Present: Physician leading conference: Dr. Claudette LawsAndrew Kirsteins Social Worker Present: Dossie DerBecky Devonne Kitchen, LCSW Nurse Present: Carmie EndAngie Joyce, RN PT Present: Katherine Mantleodney Wishart, PT OT Present: Rosalio LoudSarah Hoxie, OT SLP Present: Jackalyn LombardNicole Page, SLP PPS Coordinator present : Tora DuckMarie Noel, RN, CRRN     Current Status/Progress Goal Weekly Team Focus  Medical   Patient has right shoulder pain. Prior history exacerbated by stroke. History of bilateral knee pain, right side. Decreased sensation  Long-term goal was return to work, medium term goal is return to home at a supervision level  Address right shoulder pain, trial of FES   Bowel/Bladder   Continent of bowel/bladder; Last BM 11/21/2016  continue  continue plan of care   Swallow/Nutrition/ Hydration   upgraded to regular textures and thin liquids  mod I   skilled observations to assess toleration of upgrade    ADL's   mod assist bathing and LB dressing, min-mod assist transfers  Mod I overall, supervision shower transfers  ADL retraining, RUE NMR, transfers, Rt attention, safety awareness   Mobility   min/mod overall, ambulating with platform RW  supervision>mod I  NMR, balance, coordination, motor planning, ambulation,  transfers, safety awareness, R attention   Communication   Mild dysarthria  mod I   education and carryover of compensatory strategies    Safety/Cognition/ Behavioral Observations            Pain   pain to right shoulder; Voltaren gel x 4 to affected area; current pain is 0 out of 10  less than 2  continue plan of care   Skin                *See Care Plan and progress notes for long and short-term goals.  Barriers to Discharge: Severe sensory deficits on the right side    Possible Resolutions to Barriers:  Continue education, compensatory strategies    Discharge Planning/Teaching Needs:  HOme with wife who can provide supervision level, has her own health issues. Pt motivated to do well here.      Team Discussion:  Goals mod/i-supervision level. Working on balance, coordination and motor planning. Some r-inattention. Pt reports right side of his body is numb. Shoulder pain-MD ordered meds. Upgraded diet to regular with SP yesterday. DC sling hurting him. Would benefit from seeing nuero-psych while here.  Revisions to Treatment Plan:  none   Continued Need for Acute Rehabilitation Level of Care: The patient requires daily medical management by a physician with specialized training in physical medicine and rehabilitation for the following conditions: Daily direction of a multidisciplinary physical rehabilitation program to ensure safe treatment while eliciting the highest outcome that is of practical value to the patient.: Yes Daily medical management of patient stability for increased activity during participation in an  intensive rehabilitation regime.: Yes Daily analysis of laboratory values and/or radiology reports with any subsequent need for medication adjustment of medical intervention for : Neurological problems;Other  Lucy ChrisDupree, Gredmarie Delange G 11/22/2016, 2:48 PM

## 2016-11-23 ENCOUNTER — Inpatient Hospital Stay (HOSPITAL_COMMUNITY): Payer: PPO | Admitting: Physical Therapy

## 2016-11-23 ENCOUNTER — Inpatient Hospital Stay (HOSPITAL_COMMUNITY): Payer: PPO

## 2016-11-23 ENCOUNTER — Inpatient Hospital Stay (HOSPITAL_COMMUNITY): Payer: PPO | Admitting: Speech Pathology

## 2016-11-23 ENCOUNTER — Inpatient Hospital Stay (HOSPITAL_COMMUNITY): Payer: PPO | Admitting: Occupational Therapy

## 2016-11-23 DIAGNOSIS — I610 Nontraumatic intracerebral hemorrhage in hemisphere, subcortical: Secondary | ICD-10-CM

## 2016-11-23 DIAGNOSIS — E669 Obesity, unspecified: Secondary | ICD-10-CM | POA: Insufficient documentation

## 2016-11-23 DIAGNOSIS — M170 Bilateral primary osteoarthritis of knee: Secondary | ICD-10-CM

## 2016-11-23 DIAGNOSIS — I10 Essential (primary) hypertension: Secondary | ICD-10-CM

## 2016-11-23 DIAGNOSIS — R7303 Prediabetes: Secondary | ICD-10-CM

## 2016-11-23 DIAGNOSIS — S4991XA Unspecified injury of right shoulder and upper arm, initial encounter: Secondary | ICD-10-CM | POA: Insufficient documentation

## 2016-11-23 LAB — BASIC METABOLIC PANEL
Anion gap: 9 (ref 5–15)
BUN: 21 mg/dL — AB (ref 6–20)
CO2: 25 mmol/L (ref 22–32)
CREATININE: 0.94 mg/dL (ref 0.61–1.24)
Calcium: 9 mg/dL (ref 8.9–10.3)
Chloride: 98 mmol/L — ABNORMAL LOW (ref 101–111)
GFR calc Af Amer: >60 mL/min (ref >60)
GFR calc non Af Amer: >60 mL/min (ref >60)
GLUCOSE: 109 mg/dL — AB (ref 65–99)
Potassium: 3.5 mmol/L (ref 3.5–5.1)
SODIUM: 132 mmol/L — AB (ref 135–145)

## 2016-11-23 LAB — GLUCOSE, CAPILLARY
GLUCOSE-CAPILLARY: 97 mg/dL (ref 65–99)
GLUCOSE-CAPILLARY: 155 mg/dL — AB (ref 65–99)
Glucose-Capillary: 113 mg/dL — ABNORMAL HIGH (ref 65–99)
Glucose-Capillary: 195 mg/dL — ABNORMAL HIGH (ref 65–99)

## 2016-11-23 LAB — CBC
HCT: 41.9 % (ref 39.0–52.0)
HEMOGLOBIN: 14.9 g/dL (ref 13.0–17.0)
MCH: 31.4 pg (ref 26.0–34.0)
MCHC: 35.6 g/dL (ref 30.0–36.0)
MCV: 88.2 fL (ref 78.0–100.0)
Platelets: 306 10*3/uL (ref 150–400)
RBC: 4.75 MIL/uL (ref 4.22–5.81)
RDW: 12 % (ref 11.5–15.5)
WBC: 6.5 10*3/uL (ref 4.0–10.5)

## 2016-11-23 NOTE — Progress Notes (Addendum)
Subjective/Complaints: Participating with physical therapy is able to wheel the wheelchair with left arm and left leg Right shoulder pain mainly with elevating the shoulder at 90. Reviewed x-ray results.  Review of systems no chest pain, no shortness of breath, no bowel or bladder issues  Objective: Vital Signs: Blood pressure 128/75, pulse 66, temperature 97.8 F (36.6 C), temperature source Oral, resp. rate 16, weight 109.2 kg (240 lb 11.9 oz), SpO2 94 %. Dg Shoulder Right  Result Date: 11/22/2016 CLINICAL DATA:  Chronic right shoulder pain. History of osteoarthritis. EXAM: RIGHT SHOULDER - 2+ VIEW COMPARISON:  Portable chest 11/14/2016 FINDINGS: The mineralization and alignment are normal. There is no evidence of acute fracture or dislocation. There are moderate glenohumeral and mild acromioclavicular degenerative changes. The subacromial space appears adequately preserved. Sclerotic lesion in the distal clavicle noted, likely a bone island. IMPRESSION: No acute findings demonstrated. Degenerative changes as described, greatest in the glenohumeral joint. Electronically Signed   By: Richardean Sale M.D.   On: 11/22/2016 15:37   Results for orders placed or performed during the hospital encounter of 11/17/16 (from the past 72 hour(s))  Glucose, capillary     Status: Abnormal   Collection Time: 11/20/16 11:44 AM  Result Value Ref Range   Glucose-Capillary 118 (H) 65 - 99 mg/dL  Glucose, capillary     Status: Abnormal   Collection Time: 11/20/16  4:28 PM  Result Value Ref Range   Glucose-Capillary 101 (H) 65 - 99 mg/dL  Glucose, capillary     Status: Abnormal   Collection Time: 11/20/16  8:49 PM  Result Value Ref Range   Glucose-Capillary 122 (H) 65 - 99 mg/dL  Glucose, capillary     Status: Abnormal   Collection Time: 11/21/16  6:52 AM  Result Value Ref Range   Glucose-Capillary 126 (H) 65 - 99 mg/dL  Glucose, capillary     Status: Abnormal   Collection Time: 11/21/16 11:19 AM   Result Value Ref Range   Glucose-Capillary 122 (H) 65 - 99 mg/dL  Glucose, capillary     Status: Abnormal   Collection Time: 11/21/16  4:18 PM  Result Value Ref Range   Glucose-Capillary 125 (H) 65 - 99 mg/dL  Glucose, capillary     Status: Abnormal   Collection Time: 11/21/16  8:58 PM  Result Value Ref Range   Glucose-Capillary 122 (H) 65 - 99 mg/dL  Glucose, capillary     Status: Abnormal   Collection Time: 11/22/16  6:44 AM  Result Value Ref Range   Glucose-Capillary 136 (H) 65 - 99 mg/dL  Glucose, capillary     Status: Abnormal   Collection Time: 11/22/16 11:38 AM  Result Value Ref Range   Glucose-Capillary 101 (H) 65 - 99 mg/dL  Glucose, capillary     Status: Abnormal   Collection Time: 11/22/16  4:54 PM  Result Value Ref Range   Glucose-Capillary 100 (H) 65 - 99 mg/dL   Comment 1 Notify RN   Glucose, capillary     Status: Abnormal   Collection Time: 11/22/16  8:45 PM  Result Value Ref Range   Glucose-Capillary 197 (H) 65 - 99 mg/dL   Comment 1 Notify RN   CBC     Status: None   Collection Time: 11/23/16  5:58 AM  Result Value Ref Range   WBC 6.5 4.0 - 10.5 K/uL   RBC 4.75 4.22 - 5.81 MIL/uL   Hemoglobin 14.9 13.0 - 17.0 g/dL   HCT 41.9 39.0 - 52.0 %  MCV 88.2 78.0 - 100.0 fL   MCH 31.4 26.0 - 34.0 pg   MCHC 35.6 30.0 - 36.0 g/dL   RDW 12.0 11.5 - 15.5 %   Platelets 306 150 - 400 K/uL  Basic metabolic panel     Status: Abnormal   Collection Time: 11/23/16  5:58 AM  Result Value Ref Range   Sodium 132 (L) 135 - 145 mmol/L   Potassium 3.5 3.5 - 5.1 mmol/L   Chloride 98 (L) 101 - 111 mmol/L   CO2 25 22 - 32 mmol/L   Glucose, Bld 109 (H) 65 - 99 mg/dL   BUN 21 (H) 6 - 20 mg/dL   Creatinine, Ser 0.94 0.61 - 1.24 mg/dL   Calcium 9.0 8.9 - 10.3 mg/dL   GFR calc non Af Amer >60 >60 mL/min   GFR calc Af Amer >60 >60 mL/min    Comment: (NOTE) The eGFR has been calculated using the CKD EPI equation. This calculation has not been validated in all clinical  situations. eGFR's persistently <60 mL/min signify possible Chronic Kidney Disease.    Anion gap 9 5 - 15  Glucose, capillary     Status: Abnormal   Collection Time: 11/23/16  6:37 AM  Result Value Ref Range   Glucose-Capillary 113 (H) 65 - 99 mg/dL   Comment 1 Notify RN      General: No acute distress Mood and affect are appropriate Heart: Regular rate and rhythm no rubs murmurs or extra sounds Lungs: Clear to auscultation, breathing unlabored, no rales or wheezes Abdomen: Positive bowel sounds, soft nontender to palpation, nondistended Extremities: No clubbing, cyanosis, or edema Skin: No evidence of breakdown, no evidence of rash Neurologic: Cranial nerves II through XII intact, motor strength is 5/5 in bilateral deltoid, bicep, tricep, grip, hip flexor, knee extensors, ankle dorsiflexor and plantar flexor Sensory exam normal sensation to light touch and proprioception in left upper and lower extremities, reduced in all modalities on the right side, includes face Cerebellar exam normal finger to nose to finger as well as heel to shin in left upper and lower extremities Dysmetria noted. Right upper and right lower Musculoskeletal: Full range of motion in all 4 extremities. No joint swelling   Assessment/Plan: 1. 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 Physiatrist is providing close team supervision and 24 hour management of active medical problems listed below. Physiatrist and rehab team continue to assess barriers to discharge/monitor patient progress toward functional and medical goals. FIM: Function - Bathing Position: Shower Body parts bathed by patient: Right arm, Left arm, Chest, Abdomen, Front perineal area, Right upper leg, Left upper leg, Right lower leg, Left lower leg Body parts bathed by helper: Back Bathing not  applicable: Buttocks Assist Level: Touching or steadying assistance(Pt > 75%)  Function- Upper Body Dressing/Undressing What is the patient wearing?: Pull over shirt/dress Pull over shirt/dress - Perfomed by patient: Thread/unthread right sleeve, Thread/unthread left sleeve, Put head through opening, Pull shirt over trunk Assist Level: Supervision or verbal cues Function - Lower Body Dressing/Undressing What is the patient wearing?: Underwear, Pants, Socks, Shoes Position: Wheelchair/chair at [employer] - Performed by patient: Thread/unthread right underwear leg, Thread/unthread left underwear leg Underwear - Performed by helper: Pull underwear up/down Pants- Performed by patient: Thread/unthread right pants leg, Thread/unthread left pants leg, Pull pants up/down Pants- Performed by helper: Pull pants up/down Socks - Performed by patient:  Don/doff left sock Socks - Performed by helper: Don/doff right sock, Don/doff left sock Shoes - Performed by patient: Don/doff right shoe, Don/doff left shoe Shoes - Performed by helper: Fasten right, Fasten left Assist for footwear: Maximal assist Assist for lower body dressing:  (Mod assist)  Function - Toileting Toileting steps completed by helper: Adjust clothing prior to toileting, Performs perineal hygiene, Adjust clothing after toileting Assist level:  (Max A)  Function - Toilet Transfers Toilet transfer assistive device: Elevated toilet seat/BSC over toilet, Grab bar Assist level to toilet: Moderate assist (Pt 50 - 74%/lift or lower) Assist level from toilet: Moderate assist (Pt 50 - 74%/lift or lower)  Function - Chair/bed transfer Chair/bed transfer method: Squat pivot Chair/bed transfer assist level: Touching or steadying assistance (Pt > 75%) Chair/bed transfer assistive device: Armrests, Walker Chair/bed transfer details: Manual facilitation for weight shifting, Verbal cues for technique, Verbal cues for sequencing, Verbal cues for  precautions/safety, Tactile cues for placement, Manual facilitation for weight bearing  Function - Locomotion: Wheelchair Type: Manual Max wheelchair distance: 150 Assist Level: Supervision or verbal cues Assist Level: Supervision or verbal cues Assist Level: Supervision or verbal cues Turns around,maneuvers to table,bed, and toilet,negotiates 3% grade,maneuvers on rugs and over doorsills: No Function - Locomotion: Ambulation Assistive device: Walker-rolling Max distance: 138f  Assist level: Touching or steadying assistance (Pt > 75%) Assist level: Touching or steadying assistance (Pt > 75%) Walk 50 feet with 2 turns activity did not occur: Safety/medical concerns Assist level: Touching or steadying assistance (Pt > 75%) Walk 150 feet activity did not occur: Safety/medical concerns Walk 10 feet on uneven surfaces activity did not occur: Safety/medical concerns  Function - Comprehension Comprehension: Auditory Comprehension assist level: Follows complex conversation/direction with extra time/assistive device  Function - Expression Expression: Verbal Expression assist level: Expresses basic 90% of the time/requires cueing < 10% of the time.  Function - Social Interaction Social Interaction assist level: Interacts appropriately with others - No medications needed.  Function - Problem Solving Problem solving assist level: Solves complex problems: With extra time  Function - Memory Memory assist level: More than reasonable amount of time Patient normally able to recall (first 3 days only): Current season, Location of own room, Staff names and faces, That he or she is in a hospital  Medical Problem List and Plan: 1.  Functional and mobility deficits secondary to left thalamic hemorrhage             -continue CIR PT, OT, speech, patient very motivated. Goals are return to work as well as return to maximum independence 2.  DVT Prophylaxis/Anticoagulation: Pharmaceutical: Lovenox 3.  Pain Management:  Will add tramadol prn for knee pain/shoulder pain, has evidence of glenohumeral arthritis but also may have some rotator cuff degeneration discussed potential for steroid injection. He does not wish to proceed with this at the current time. We will ask PT, OT. to Trial FES, add Kpad 4. Mood: Team to provide ego support. Under lot of stress due to business commitments/end of year. LCSW to follow for evaluation and support.  5. Neuropsych: This patient is capable of making decisions on his own behalf. 6. Skin/Wound Care: Routine pressure relief measures. Maintain adequate nutritional and hydration status.  7. Fluids/Electrolytes/Nutrition: Monitor I/O. Fluids ~507mper day, ~75% meals, need to enc fluids 8. HTN: Monitor BP bid--on HCTZ and cozaar. Most recent BUN, creatinine normal, as below BMP Latest Ref Rng & Units 11/23/2016 11/18/2016  Glucose 65 - 99 mg/dL 109(H) 95  BUN  6 - 20 mg/dL 21(H) 20  Creatinine 0.61 - 1.24 mg/dL 0.94 0.89  Sodium 135 - 145 mmol/L 132(L) 135  Potassium 3.5 - 5.1 mmol/L 3.5 4.0  Chloride 101 - 111 mmol/L 98(L) 101  CO2 22 - 32 mmol/L 25 22  Calcium 8.9 - 10.3 mg/dL 9.0 9.1   9. Chronic right shoulder pain/likely RTC/degenerative disease: Will Discontinue slingAdd  Voltaren gel qid. 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              -consider supportive/OA knee brace for support  11. Prediabetes: Hgb A1c- 6.3. Will add CM restrictions. Consult RD to educate patient on diet.  12. MRI demonstrating left thalamic hemorrhage, also involving corona radiata, no additional areas of infarct noted,  stroke team recommending carotid Dopplers. Will order  CBC reviewed normal LOS (Days) 6 A FACE TO FACE EVALUATION WAS PERFORMED  Brieann Osinski E 11/23/2016, 9:37 AM

## 2016-11-23 NOTE — Progress Notes (Signed)
Occupational Therapy Session Note  Patient Details  Name: [name] MRN: [id number] Date of Birth: 06/24/1951  Today's Date: 11/23/2016 OT Individual Time: [id number] OT Individual Time Calculation (min): 55 min     Short Term Goals: Week 1:  OT Short Term Goal 1 (Week 1): Pt will complete toilet transfers and LRAD with Min A OT Short Term Goal 2 (Week 1): Pt will complete LB dressing with Max A OT Short Term Goal 3 (Week 1): Pt will complete bathing with Min A OT Short Term Goal 4 (Week 1): Pt will complete shower transfer with Min A and LRAD OT Short Term Goal 5 (Week 1): Pt will utilize R UE during ADL routine with min multimodal cues  Skilled Therapeutic Interventions/Progress Updates:    Treatment session with focus on functional ambulation, standing balance, and functional use of RUE.  Pt received in w/c in room deferring bathing and dressing at this time reporting clothes in laundry.  Ambulated approx 60 feet to laundry room with PFRW with min assist, requiring increased assistance and cues for obstacle negotiation and problem solving to access laundry room and washing machine.  Noted pt to be somewhat impulsive with maneuvering PFRW in small space.  Engaged in RUE NMR with focus on grip and manipulation of pegs to follow visual pattern.  Pt required increased time, only dropping 2 of 20 pegs with noted improvements in manipulation and strength.  Removed pegs in standing incorporating trunk rotation to simulate functional reaching.  Returned to room via w/c and left upright awaiting SLP.  Therapy Documentation Precautions:  Precautions Precautions: Fall Precaution Comments: Absent R sided proprioception, severe OA R knee-don knee support brace Required Braces or Orthoses: Sling (for R UE support when OOB) Restrictions Weight Bearing Restrictions: No General:   Vital Signs: Therapy Vitals Temp: 98.1 F (36.7 C) Temp Source: Oral Resp: 16 BP: 132/66 Patient Position  (if appropriate): Sitting Oxygen Therapy SpO2: 94 % O2 Device: Not Delivered Pain:  Pt with no c/o pain  See Function Navigator for Current Functional Status.   Therapy/Group: Individual Therapy  Rosalio LoudHOXIE, Arrow Tomko 11/23/2016, 3:31 PM

## 2016-11-23 NOTE — Progress Notes (Signed)
Discussed patient with Annie MainSharon Biby NP with stroke team. She recommended carotid dopplers for full work up and repeat MRI brain in 2 months for follow up on bleed.

## 2016-11-23 NOTE — Progress Notes (Signed)
Physical Therapy Session Note  Patient Details  Name: [name] MRN: [id number] Date of Birth: 05/19/1951  Today's Date: 11/23/2016 PT Individual Time: 0906-1002 PT Individual Time Calculation (min): 56 min    Short Term Goals:Week 1:  PT Short Term Goal 1 (Week 1): Pt will perform bed mobility on flat bed with supervision PT Short Term Goal 2 (Week 1): Pt will perform bed <> chair transfers with min A and 25% cues for sequencing  PT Short Term Goal 3 (Week 1): Pt will perform w/c mobility x 50' with bilat UE propulsion for increased RUE attention, coordination. PT Short Term Goal 4 (Week 1): Pt will perform gait training x 75' with LRAD and min A PT Short Term Goal 5 (Week 1): Pt will negotiate 4 larger stairs with one UE support and min A  Skilled Therapeutic Interventions/Progress Updates:  Pt resting in w/c on arrival, no c/o pain and agreeable to therapy session.  Session focus on functional use of RUE during laundry task, w/c propulsion, transfers, NMR, and ambulation.    Pt propels w/c throughout unit, max distance 150' with L hemi technique and mod verbal cues for efficient propulsion with little carryover noted.  Pt continues to utilize trunk mobility to help propel w/c in addition to LUE/LLE despite cues to reduce reliance on trunk flex/ext.    Functional use of RUE during laundry task with min verbal cues for use of unfamiliar machine.    Pt performs squat and stand/pivot transfers throughout session with overall steady assist and min verbal cues for correct hand placement and head/hips relationship.    NMR as follows: BLE support bridging with adductor squeeze 2x15 reps, mod cues for breathing, maintaining contraction in adductors, and positioning of LEs.  [name] x8 minutes with 4 extremities, 2# wrist weight on RUE for reciprocal stepping pattern, forced use, and focus attention on positioning of RUE/RLE during task.    Ambulation x100' with PRW with steady assist and  min verbal cues for attention to RLE, step length, and upright posture.    Pt returned to room at end of session and left upright in w/c with call bell in reach and needs met.      Therapy Documentation Precautions:  Precautions Precautions: Fall Precaution Comments: Absent R sided proprioception, severe OA R knee-don knee support brace Required Braces or Orthoses: Sling (for R UE support when OOB) Restrictions Weight Bearing Restrictions: No   See Function Navigator for Current Functional Status.   Therapy/Group: Individual Therapy  Cameron Katayama E Penven-Crew 11/23/2016, 9:45 AM

## 2016-11-23 NOTE — Progress Notes (Signed)
Physical Therapy Session Note  Patient Details  Name: [name] MRN: [id number] Date of Birth: Sep 07, 1951  Today's Date: 11/23/2016 PT Individual Time: 1445-1510 PT Individual Time Calculation (min): 25 min    Short Term Goals: Week 1:  PT Short Term Goal 1 (Week 1): Pt will perform bed mobility on flat bed with supervision PT Short Term Goal 2 (Week 1): Pt will perform bed <> chair transfers with min A and 25% cues for sequencing  PT Short Term Goal 3 (Week 1): Pt will perform w/c mobility x 50' with bilat UE propulsion for increased RUE attention, coordination. PT Short Term Goal 4 (Week 1): Pt will perform gait training x 75' with LRAD and min A PT Short Term Goal 5 (Week 1): Pt will negotiate 4 larger stairs with one UE support and min A  Skilled Therapeutic Interventions/Progress Updates:    Pt resting in w/c on arrival with no c/o pain and agreeable to session focus on w/c mobility.  Pt propelled w/c throughout unit with BLEs and LUE.  Pt noted to be able to more efficiently propel w/c in this manner with less compensation in trunk.  Pt completed forward/backwards weaving through cones for increased challenge and obstacle negotiation with increased attention to R side when weaving backwards through cones.  Pt returned to room in w/c at end of session and left upright with call bell in reach and needs met.   Therapy Documentation Precautions:  Precautions Precautions: Fall Precaution Comments: Absent R sided proprioception, severe OA R knee-don knee support brace Required Braces or Orthoses: Sling (for R UE support when OOB) Restrictions Weight Bearing Restrictions: No   See Function Navigator for Current Functional Status.   Therapy/Group: Individual Therapy  Earnest Conroy Penven-Crew 11/23/2016, 3:17 PM

## 2016-11-23 NOTE — Progress Notes (Signed)
**  Preliminary report by tech**  Carotid artery duplex complete. Findings are consistent with a 1-39 percent stenosis involving the right internal carotid artery and the left internal carotid artery. The vertebral arteries demonstrate antegrade flow.   11/23/16 5:18 PM Olen CordialGreg Coy Vandoren RVT

## 2016-11-23 NOTE — Progress Notes (Signed)
Speech Language Pathology Session Note & Discharge Summary  Patient Details  Name: [name] MRN: [id number] Date of Birth: March 24, 1951  Today's Date: 11/23/2016 SLP Individual Time: 1133-1220 SLP Individual Time Calculation (min): 47 min    Skilled Therapeutic Interventions:   Skilled treatment session focused on dysphagia and cognitive goals. SLP facilitated session by providing skilled observation with lunch meal of regular textures with thin liquids. Patient intermittently utilized large bites/sips with intermittent right anterior spillage that patinet self-corrected. Patient consumed meal without overt s/s of aspiration and recommend patient continue current diet. Patient also was 100% intelligible at the conversation level with Mod I. Patient left upright in wheelchair with all needs within reach.    Patient has met 5 of 5 long term goals.  Patient to discharge at overall Modified Independent level.   Reasons goals not met: N/A   Clinical Impression/Discharge Summary: Patient has made excellent gains and has met 5 of 5 LTG's this admission. Currently, patient is consuming regular textures with thin liquids without overt s/s of aspiration and is Mod I for use of swallowing compensatory strategies. Patient is also 100% intelligible at the conversation level with extra time and Mod I. Patient has met all  LTG's at this time, therefore, patient will discharge from skilled SLP intervention and f/u is not warranted at this time.   Recommendation:  None      Equipment: N/A   Reasons for discharge: Treatment goals met   Patient/Family Agrees with Progress Made and Goals Achieved: Yes   Function:  Eating Eating   Modified Consistency Diet: No Eating Assist Level: Swallowing techniques: self managed           Cognition Comprehension Comprehension assist level: Follows complex conversation/direction with extra time/assistive device  Expression   Expression assist level:  Expresses basic needs/ideas: With extra time/assistive device  Social Interaction Social Interaction assist level: Interacts appropriately with others - No medications needed.  Problem Solving Problem solving assist level: Solves complex problems: With extra time  Memory Memory assist level: More than reasonable amount of time   [name] 11/23/2016, 3:32 PM

## 2016-11-24 ENCOUNTER — Inpatient Hospital Stay (HOSPITAL_COMMUNITY): Payer: PPO | Admitting: Occupational Therapy

## 2016-11-24 ENCOUNTER — Inpatient Hospital Stay (HOSPITAL_COMMUNITY): Payer: PPO | Admitting: Physical Therapy

## 2016-11-24 LAB — GLUCOSE, CAPILLARY
GLUCOSE-CAPILLARY: 106 mg/dL — AB (ref 65–99)
GLUCOSE-CAPILLARY: 157 mg/dL — AB (ref 65–99)
GLUCOSE-CAPILLARY: 95 mg/dL (ref 65–99)
GLUCOSE-CAPILLARY: 105 mg/dL — AB (ref 65–99)

## 2016-11-24 NOTE — Progress Notes (Signed)
Physical Therapy Weekly Progress Note  Patient Details  Name: [name] MRN: [id number] Date of Birth: 07/15/1951  Beginning of progress report period: November 18, 2016 End of progress report period: November 24, 2016  Today's Date: 11/24/2016 PT Individual Time: 0539-7673 PT Individual Time Calculation (min): 55 min    Patient has met 5 of 5 short term goals.  Pt has made excellent progress with therapy in the past week and is currently walking with supervision/min assist with platform RW.  Pt consistently utilizes RUE during functional tasks with min>supervision cues and demonstrates good carryover between sessions.   Patient continues to demonstrate the following deficits muscle weakness, decreased cardiorespiratoy endurance, impaired timing and sequencing, unbalanced muscle activation, ataxia, decreased coordination and decreased motor planning and decreased standing balance, decreased postural control and decreased balance strategies and therefore will continue to benefit from skilled PT intervention to increase functional independence with mobility.  Patient progressing toward long term goals..  Continue plan of care.  PT Short Term Goals Week 1:  PT Short Term Goal 1 (Week 1): Pt will perform bed mobility on flat bed with supervision PT Short Term Goal 1 - Progress (Week 1): Met PT Short Term Goal 2 (Week 1): Pt will perform bed <> chair transfers with min A and 25% cues for sequencing  PT Short Term Goal 2 - Progress (Week 1): Met PT Short Term Goal 3 (Week 1): Pt will perform w/c mobility x 50' with bilat UE propulsion for increased RUE attention, coordination. PT Short Term Goal 3 - Progress (Week 1): Met PT Short Term Goal 4 (Week 1): Pt will perform gait training x 75' with LRAD and min A PT Short Term Goal 4 - Progress (Week 1): Met PT Short Term Goal 5 (Week 1): Pt will negotiate 4 larger stairs with one UE support and min A PT Short Term Goal 5 - Progress (Week 1):  Met Week 2:   =LTGs due to ELOS   Skilled Therapeutic Interventions/Progress Updates:    Pt resting in w/c on arrival, requesting to get washed up for the day, no c/o pain.  Session focus on dynamic sitting and standing balance during bathing at shower level and dressing from w/c with sit<>stand, transfers, and gait training.  Pt performed stand/pivot w/c<>tub bench in walk in shower with supervision to enter and steady assist to exit.  Pt demonstrates dynamic sitting and standing balance with supervision during shower to wash lower legs (min cues to attend to RLE), and buttocks.  Dressing from w/c level with sit<>stand to pull underwear and pants over hips with supervision for balance.  PT donned socks and shoes total assist as pt reporting fatigue and difficulty with this task PTA.  Gait training x83' with PRW and overall supervision with pt self cueing for RLE knee flexion during swing through, step length, and upright posture.  Pt returned to room in w/c, propelling with BLEs and LUE and left upright with call bell in reach and needs met.   Therapy Documentation Precautions:  Precautions Precautions: Fall Precaution Comments: Absent R sided proprioception, severe OA R knee-don knee support brace Required Braces or Orthoses: Sling (for R UE support when OOB) Restrictions Weight Bearing Restrictions: No   See Function Navigator for Current Functional Status.  Therapy/Group: Individual Therapy  Earnest Conroy Penven-Crew 11/24/2016, 9:35 AM

## 2016-11-24 NOTE — Progress Notes (Signed)
Subjective/Complaints: Patient without new complaints today. He has just been well in therapy. Sleeping okay.  Review of systems no chest pain, no shortness of breath, no bowel or bladder issues  Objective: Vital Signs: Blood pressure 123/68, pulse 65, temperature 98 F (36.7 C), temperature source Oral, resp. rate 17, weight 109.2 kg (240 lb 11.9 oz), SpO2 98 %. Dg Shoulder Right  Result Date: 11/22/2016 CLINICAL DATA:  Chronic right shoulder pain. History of osteoarthritis. EXAM: RIGHT SHOULDER - 2+ VIEW COMPARISON:  Portable chest 11/14/2016 FINDINGS: The mineralization and alignment are normal. There is no evidence of acute fracture or dislocation. There are moderate glenohumeral and mild acromioclavicular degenerative changes. The subacromial space appears adequately preserved. Sclerotic lesion in the distal clavicle noted, likely a bone island. IMPRESSION: No acute findings demonstrated. Degenerative changes as described, greatest in the glenohumeral joint. Electronically Signed   By: Richardean Sale M.D.   On: 11/22/2016 15:37   Results for orders placed or performed during the hospital encounter of 11/17/16 (from the past 72 hour(s))  Glucose, capillary     Status: Abnormal   Collection Time: 11/21/16  4:18 PM  Result Value Ref Range   Glucose-Capillary 125 (H) 65 - 99 mg/dL  Glucose, capillary     Status: Abnormal   Collection Time: 11/21/16  8:58 PM  Result Value Ref Range   Glucose-Capillary 122 (H) 65 - 99 mg/dL  Glucose, capillary     Status: Abnormal   Collection Time: 11/22/16  6:44 AM  Result Value Ref Range   Glucose-Capillary 136 (H) 65 - 99 mg/dL  Glucose, capillary     Status: Abnormal   Collection Time: 11/22/16 11:38 AM  Result Value Ref Range   Glucose-Capillary 101 (H) 65 - 99 mg/dL  Glucose, capillary     Status: Abnormal   Collection Time: 11/22/16  4:54 PM  Result Value Ref Range   Glucose-Capillary 100 (H) 65 - 99 mg/dL   Comment 1 Notify RN    Glucose, capillary     Status: Abnormal   Collection Time: 11/22/16  8:45 PM  Result Value Ref Range   Glucose-Capillary 197 (H) 65 - 99 mg/dL   Comment 1 Notify RN   CBC     Status: None   Collection Time: 11/23/16  5:58 AM  Result Value Ref Range   WBC 6.5 4.0 - 10.5 K/uL   RBC 4.75 4.22 - 5.81 MIL/uL   Hemoglobin 14.9 13.0 - 17.0 g/dL   HCT 41.9 39.0 - 52.0 %   MCV 88.2 78.0 - 100.0 fL   MCH 31.4 26.0 - 34.0 pg   MCHC 35.6 30.0 - 36.0 g/dL   RDW 12.0 11.5 - 15.5 %   Platelets 306 150 - 400 K/uL  Basic metabolic panel     Status: Abnormal   Collection Time: 11/23/16  5:58 AM  Result Value Ref Range   Sodium 132 (L) 135 - 145 mmol/L   Potassium 3.5 3.5 - 5.1 mmol/L   Chloride 98 (L) 101 - 111 mmol/L   CO2 25 22 - 32 mmol/L   Glucose, Bld 109 (H) 65 - 99 mg/dL   BUN 21 (H) 6 - 20 mg/dL   Creatinine, Ser 0.94 0.61 - 1.24 mg/dL   Calcium 9.0 8.9 - 10.3 mg/dL   GFR calc non Af Amer >60 >60 mL/min   GFR calc Af Amer >60 >60 mL/min    Comment: (NOTE) The eGFR has been calculated using the CKD EPI equation. This  calculation has not been validated in all clinical situations. eGFR's persistently <60 mL/min signify possible Chronic Kidney Disease.    Anion gap 9 5 - 15  Glucose, capillary     Status: Abnormal   Collection Time: 11/23/16  6:37 AM  Result Value Ref Range   Glucose-Capillary 113 (H) 65 - 99 mg/dL   Comment 1 Notify RN   Glucose, capillary     Status: Abnormal   Collection Time: 11/23/16 12:29 PM  Result Value Ref Range   Glucose-Capillary 195 (H) 65 - 99 mg/dL  Glucose, capillary     Status: None   Collection Time: 11/23/16  5:47 PM  Result Value Ref Range   Glucose-Capillary 97 65 - 99 mg/dL  Glucose, capillary     Status: Abnormal   Collection Time: 11/23/16  8:53 PM  Result Value Ref Range   Glucose-Capillary 155 (H) 65 - 99 mg/dL  Glucose, capillary     Status: Abnormal   Collection Time: 11/24/16  7:01 AM  Result Value Ref Range   Glucose-Capillary  106 (H) 65 - 99 mg/dL  Glucose, capillary     Status: Abnormal   Collection Time: 11/24/16 12:17 PM  Result Value Ref Range   Glucose-Capillary 157 (H) 65 - 99 mg/dL   Comment 1 Notify RN      General: No acute distress Mood and affect are appropriate Heart: Regular rate and rhythm no rubs murmurs or extra sounds Lungs: Clear to auscultation, breathing unlabored, no rales or wheezes Abdomen: Positive bowel sounds, soft nontender to palpation, nondistended Extremities: No clubbing, cyanosis, or edema Skin: No evidence of breakdown, no evidence of rash Neurologic: Cranial nerves II through XII intact, motor strength is 5/5 in bilateral deltoid, bicep, tricep, grip, hip flexor, knee extensors, ankle dorsiflexor and plantar flexor Sensory exam normal sensation to light touch and proprioception in left upper and lower extremities, reduced in all modalities on the right side, includes face Cerebellar exam normal finger to nose to finger as well as heel to shin in left upper and lower extremities Dysmetria noted. Right upper and right lower Musculoskeletal: Full range of motion in all 4 extremities. No joint swelling   Assessment/Plan: 1. 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 Physiatrist is providing close team supervision and 24 hour management of active medical problems listed below. Physiatrist and rehab team continue to assess barriers to discharge/monitor patient progress toward functional and medical goals. FIM: Function - Bathing Position: Shower Body parts bathed by patient: Right arm, Left arm, Chest, Abdomen, Front perineal area, Right upper leg, Left upper leg, Right lower leg, Left lower leg, Buttocks Body parts bathed by helper: Back Bathing not applicable: Back Assist Level: Supervision or verbal cues  Function- Upper Body  Dressing/Undressing What is the patient wearing?: Pull over shirt/dress Pull over shirt/dress - Perfomed by patient: Thread/unthread right sleeve, Thread/unthread left sleeve, Put head through opening, Pull shirt over trunk Assist Level: More than reasonable time Function - Lower Body Dressing/Undressing What is the patient wearing?: Underwear, Pants, Socks, Shoes Position: Wheelchair/chair at [employer] - Performed by patient: Thread/unthread right underwear leg, Thread/unthread left underwear leg, Pull underwear up/down Underwear - Performed by helper: Pull underwear up/down Pants- Performed by patient: Thread/unthread right pants leg, Thread/unthread left pants leg, Pull pants up/down Pants- Performed by helper: Pull pants up/down Socks - Performed by patient: Don/doff left sock Socks -  Performed by helper: Don/doff right sock, Don/doff left sock Shoes - Performed by patient: Don/doff right shoe, Don/doff left shoe Shoes - Performed by helper: Don/doff right shoe, Don/doff left shoe, Fasten right, Fasten left Assist for footwear: Maximal assist Assist for lower body dressing:  (Mod assist)  Function - Toileting Toileting steps completed by patient: Adjust clothing prior to toileting, Performs perineal hygiene, Adjust clothing after toileting Toileting steps completed by helper: Adjust clothing prior to toileting, Performs perineal hygiene, Adjust clothing after toileting Toileting Assistive Devices: Grab bar or rail Assist level: Touching or steadying assistance (Pt.75%)  Function - Toilet Transfers Toilet transfer assistive device: Elevated toilet seat/BSC over toilet, Grab bar Assist level to toilet: Moderate assist (Pt 50 - 74%/lift or lower) Assist level from toilet: Moderate assist (Pt 50 - 74%/lift or lower)  Function - Chair/bed transfer Chair/bed transfer method: Stand pivot Chair/bed transfer assist level: Touching or steadying assistance (Pt > 75%) Chair/bed transfer  assistive device: Armrests Chair/bed transfer details: Verbal cues for technique, Verbal cues for sequencing, Verbal cues for precautions/safety, Tactile cues for placement  Function - Locomotion: Wheelchair Type: Manual Max wheelchair distance: 150 Assist Level: Supervision or verbal cues Assist Level: Supervision or verbal cues Assist Level: Supervision or verbal cues Turns around,maneuvers to table,bed, and toilet,negotiates 3% grade,maneuvers on rugs and over doorsills: No Function - Locomotion: Ambulation Assistive device: Walker-platform Max distance: 170 Assist level: Supervision or verbal cues Assist level: Supervision or verbal cues Walk 50 feet with 2 turns activity did not occur: Safety/medical concerns Assist level: Supervision or verbal cues Walk 150 feet activity did not occur: Safety/medical concerns Assist level: Supervision or verbal cues Walk 10 feet on uneven surfaces activity did not occur: Safety/medical concerns  Function - Comprehension Comprehension: Auditory Comprehension assist level: Follows complex conversation/direction with extra time/assistive device  Function - Expression Expression: Verbal Expression assist level: Expresses basic needs/ideas: With extra time/assistive device  Function - Social Interaction Social Interaction assist level: Interacts appropriately with others - No medications needed.  Function - Problem Solving Problem solving assist level: Solves complex problems: With extra time  Function - Memory Memory assist level: More than reasonable amount of time Patient normally able to recall (first 3 days only): Current season, Location of own room, Staff names and faces, That he or she is in a hospital  Medical Problem List and Plan: 1.  Functional and mobility deficits secondary to left thalamic hemorrhage             -continue CIR PT, OT, speech,  2.  DVT Prophylaxis/Anticoagulation: Pharmaceutical: Lovenox 3. Pain Management:   Will add tramadol prn for knee pain/shoulder pain, has evidence of glenohumeral arthritis but also may have some rotator cuff degeneration discussed potential for steroid injection. He does not wish to proceed with this at the current time. We will ask PT, OT. to Trial FES, add Kpad 4. Mood: Team to provide ego support. Under lot of stress due to business commitments/end of year. LCSW to follow for evaluation and support.  5. Neuropsych: This patient is capable of making decisions on his own behalf. 6. Skin/Wound Care: Routine pressure relief measures. Maintain adequate nutritional and hydration status.  7. Fluids/Electrolytes/Nutrition: Monitor I/O. Fluids ~576m per day, ~75% meals, need to enc fluids 8. HTN: Monitor BP bid--on HCTZ and cozaar. Most recent BUN, creatinine normal, as below BMP Latest Ref Rng & Units 11/23/2016 11/18/2016  Glucose 65 - 99 mg/dL 109(H) 95  BUN 6 - 20 mg/dL 21(H) 20  Creatinine 0.61 - 1.24 mg/dL 0.94 0.89  Sodium 135 - 145 mmol/L 132(L) 135  Potassium 3.5 - 5.1 mmol/L 3.5 4.0  Chloride 101 - 111 mmol/L 98(L) 101  CO2 22 - 32 mmol/L 25 22  Calcium 8.9 - 10.3 mg/dL 9.0 9.1   9. Chronic right shoulder pain/likely RTC/degenerative disease: Will Discontinue slingAdd  Voltaren gel qid. 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              -consider supportive/OA knee brace for support  11. Prediabetes: Hgb A1c- 6.3. Will add CM restrictions. Consult RD to educate patient on diet.  12. MRI demonstrating left thalamic hemorrhage, also involving corona radiata, no additional areas of infarct noted,  Carotid Dopplers, negative, recommending follow-up MRI in approximately 3 months. LOS (Days) 7 A FACE TO FACE EVALUATION WAS PERFORMED  Latoya Maulding E 11/24/2016, 1:01 PM

## 2016-11-24 NOTE — Progress Notes (Signed)
Physical Therapy Session Note  Patient Details  Name: Larsen Dungan MRN: [id number] Date of Birth: 1951/08/10  Today's Date: 11/24/2016 PT Individual Time: 1135-1203 PT Individual Time Calculation (min): 28 min    Short Term Goals: Week 2:  PT Short Term Goal 1 (Week 2): =LTGs due to ELOS  Skilled Therapeutic Interventions/Progress Updates:    Pt resting in w/c with no c/o pain, agreeable to therapy session.  Session focus on RLE NMR and transfers.  Pt propelled w/c to therapy gym with BLEs for reciprocal stepping pattern and LUE for improved propulsion.  Pt transfers to therapy mat on pt's R, pt performed sit<>stand with supervision but required steady assist for pivot as he turned to the L side.  NMR for LLE with D1/D2 x15 reps AROM and 2x8 reps with resistance, 2x10 reps with adductor hold and red ball between knees for visual feedback.  Pt returned to room at end of session and left upright with call bell in reach and needs met.   Therapy Documentation Precautions:  Precautions Precautions: Fall Precaution Comments: Absent R sided proprioception, severe OA R knee-don knee support brace Required Braces or Orthoses: Sling (for R UE support when OOB) Restrictions Weight Bearing Restrictions: No   See Function Navigator for Current Functional Status.   Therapy/Group: Individual Therapy  Earnest Conroy Penven-Crew 11/24/2016, 12:04 PM

## 2016-11-24 NOTE — Progress Notes (Signed)
Occupational Therapy Weekly Progress Note  Patient Details  Name: [name] MRN: [id number] Date of Birth: [birth year]  Beginning of progress report period: November 18, 2016 End of progress report period: November 24, 2016  Today's Date: 11/24/2016 OT Individual Time: 7829-5621 and 1335-1435 OT Individual Time Calculation (min): 55 min and 60 min    Patient has met 5 of 5 short term goals.  Pt is making steady progress towards goals.  Pt currently requires min assist with stand pivot transfers and min-mod assist with PFRW due to cumbersome maneuvering through small spaces.  Pt is demonstrating improved motor control and functional use of RUE during self-care tasks.  Pt would benefit from increased mobility in functional scenarios with RW as well as continued RUE NMR.  Patient continues to demonstrate the following deficits: muscle weakness, decreased cardiorespiratoy endurance, unbalanced muscle activation, ataxia and decreased coordination, decreased attention to right and decreased postural control and Rt hemiplegia  and therefore will continue to benefit from skilled OT intervention to enhance overall performance with BADL and Reduce care partner burden.  Patient progressing toward long term goals..  Continue plan of care.  OT Short Term Goals Week 1:  OT Short Term Goal 1 (Week 1): Pt will complete toilet transfers and LRAD with Min A OT Short Term Goal 1 - Progress (Week 1): Met OT Short Term Goal 2 (Week 1): Pt will complete LB dressing with Max A OT Short Term Goal 2 - Progress (Week 1): Met OT Short Term Goal 3 (Week 1): Pt will complete bathing with Min A OT Short Term Goal 3 - Progress (Week 1): Met OT Short Term Goal 4 (Week 1): Pt will complete shower transfer with Min A and LRAD OT Short Term Goal 4 - Progress (Week 1): Met OT Short Term Goal 5 (Week 1): Pt will utilize R UE during ADL routine with min multimodal cues OT Short Term Goal 5 - Progress (Week 1):  Met Week 2:  OT Short Term Goal 1 (Week 2): Pt will complete shower transfers with RW vs PFRW with supervision OT Short Term Goal 2 (Week 2): Pt will complete toilet transfer with RW vs PFRW with supervision OT Short Term Goal 3 (Week 2): Pt will complete 2 grooming tasks in standing with supervision   Skilled Therapeutic Interventions/Progress Updates:    1) Treatment session with focus on RUE NMR and functional mobility.  Pt propelled w/c to therapy gym with BLE and LUE with improved sequencing and speed this session.  Stand pivot transfer to Rt min guard.  Issued shoe buttons to increase independence with LB dressing.  Pt able to doff and don shoes with increased time and demonstrated ability to fasten shoes with shoe button.  Engaged in [place] with focus on fine motor control with picking up checker sized pieces from table, placing checkers into container, stacking checkers, and then manipulating checkers to place into Connect 4 grid.  Pt required increased time when placing items into box across midline due to body habitus and weakness in Rt shoulder.  Noted improvements with manipulation and ability to place checkers into Connect 4 grid with no c/o pain and improved "follow through" with pt demonstrating improved motor control reaching to target as well as when reaching back to stack.  2) Treatment session with focus on RUE NMR and addressing Rt shoulder pain.  Applied FES to pt Rt shoulder at supraspinatus and inferior deltoid with focus on decreased pain with reaching, most specifically internal rotation.  Pt tolerated FES for 15 mins before removal by therapist.  Pt with no c/o pain during of after.  While FES applied, engaged in reaching activity incorporating functional movements with cross midline and reaching towards internal rotation and abduction to grasp and place cups.  Pt reports no pain during movements this session, however noting increased focus on activity.  Progressed to picking up  small animal figurines in standing with increased focus on standing balance and tolerance while incorporating fine motor control.  Pt reports increased frustration with fine motor control task, requiring seated rest break x2.  Returned to w/c squat pivot with supervision and propelled w/c back to room with BLE and LUE.  Therapy Documentation Precautions:  Precautions Precautions: Fall Precaution Comments: Absent R sided proprioception, severe OA R knee-don knee support brace Required Braces or Orthoses: Sling (for R UE support when OOB) Restrictions Weight Bearing Restrictions: No General:   Vital Signs:   Pain:   ADL: ADL ADL Comments: Please see functional navigator for ADL status Exercises:   Other Treatments:    See Function Navigator for Current Functional Status.   Therapy/Group: Individual Therapy  Simonne Come 11/24/2016, 12:26 PM

## 2016-11-25 ENCOUNTER — Encounter (HOSPITAL_COMMUNITY): Payer: PPO

## 2016-11-25 ENCOUNTER — Inpatient Hospital Stay (HOSPITAL_COMMUNITY): Payer: PPO | Admitting: Occupational Therapy

## 2016-11-25 ENCOUNTER — Inpatient Hospital Stay (HOSPITAL_COMMUNITY): Payer: PPO

## 2016-11-25 DIAGNOSIS — G8929 Other chronic pain: Secondary | ICD-10-CM

## 2016-11-25 DIAGNOSIS — R7303 Prediabetes: Secondary | ICD-10-CM

## 2016-11-25 DIAGNOSIS — E871 Hypo-osmolality and hyponatremia: Secondary | ICD-10-CM

## 2016-11-25 DIAGNOSIS — M25511 Pain in right shoulder: Secondary | ICD-10-CM

## 2016-11-25 LAB — GLUCOSE, CAPILLARY
GLUCOSE-CAPILLARY: 119 mg/dL — AB (ref 65–99)
Glucose-Capillary: 87 mg/dL (ref 65–99)
Glucose-Capillary: 104 mg/dL — ABNORMAL HIGH (ref 65–99)
Glucose-Capillary: 127 mg/dL — ABNORMAL HIGH (ref 65–99)

## 2016-11-25 NOTE — Progress Notes (Signed)
Occupational Therapy Session Note  Patient Details  Name: Justin FillerStephen [name] MRN: [id number] Date of Birth: 01/20/1951  Today's Date: 11/25/2016 OT Individual Time: [id number] OT Individual Time Calculation (min): 72 min     Short Term Goals: Week 2:  OT Short Term Goal 1 (Week 2): Pt will complete shower transfers with RW vs PFRW with supervision OT Short Term Goal 2 (Week 2): Pt will complete toilet transfer with RW vs PFRW with supervision OT Short Term Goal 3 (Week 2): Pt will complete 2 grooming tasks in standing with supervision    Skilled Therapeutic Interventions/Progress Updates:    Pt initially engaged in BADL retraining including toilet transfers, toielting, shower transfers, bathing at shower level, dressing with sit<>stand from w/c at sink, and grooming tasks.  Pt required set up assist for bathing/dressing tasks and grooming tasks.  Pt performed all stand pivot transfers at close supervision level.  Pt transitioned to therapy gym and engaged in RUE table activities with focus on grasp/release, manipulation, and reaching for objects.  Pt requires extra time to complete all tasks.  Pt requires min verbal cues to attend to RUE when not engaged functionally. Pt returned to room and remained in w/c with all needs within reach.  Focus on activity tolerance, sit<>stand, functional transfers, standing balance, BADL retraining, RUE functional use, and safety awareness to increase independence with BADLs.  Therapy Documentation Precautions:  Precautions Precautions: Fall Precaution Comments: Absent R sided proprioception, severe OA R knee-don knee support brace Required Braces or Orthoses: Sling (for R UE support when OOB) Restrictions Weight Bearing Restrictions: No Pain:  Pt denied pain  See Function Navigator for Current Functional Status.   Therapy/Group: Individual Therapy  [name], [name] 11/25/2016, 9:15 AM

## 2016-11-25 NOTE — Plan of Care (Signed)
Problem: RH KNOWLEDGE DEFICIT Goal: RH STG INCREASE KNOWLEDGE OF HYPERTENSION PT will be able to manage HTN with cues/resources for HH, CMM and medication control. Pt will be able to state medications prescribed and schedule for medications with cues  Outcome: Progressing States medications

## 2016-11-25 NOTE — Progress Notes (Signed)
Occupational Therapy Note  Patient Details  Name: [name] MRN: [id number] Date of Birth: 09/14/1951  Today's Date: 11/25/2016 OT Concurrent Time: 1300-1400 OT Concurrent Time Calculation (min): 60 min  Pt initially engaged in RUE activity placing pegs in peg board in specified pattern.  Pt requires more than a reasonable amount of time to complete tasks with difficulty noted with grasp and release and gross motor control.  Pt transitioned to standing task with focus on increased RUE use and control.  Pt propelled back to room and remained in w/c with all needs within reach.   Pt denied pain  [name], Josue Falconi Chappell 11/25/2016, 2:01 PM

## 2016-11-25 NOTE — Progress Notes (Signed)
Occupational Therapy Session Note  Patient Details  Name: [name] MRN: [id number] Date of Birth: [birth year]  Today's Date: 11/25/2016 OT Individual Time: 1000-1100 OT Individual Time Calculation (min): 60 min   GRoup    Short Term Goals: Week 1:  OT Short Term Goal 1 (Week 1): Pt will complete toilet transfers and LRAD with Min A OT Short Term Goal 1 - Progress (Week 1): Met OT Short Term Goal 2 (Week 1): Pt will complete LB dressing with Max A OT Short Term Goal 2 - Progress (Week 1): Met OT Short Term Goal 3 (Week 1): Pt will complete bathing with Min A OT Short Term Goal 3 - Progress (Week 1): Met OT Short Term Goal 4 (Week 1): Pt will complete shower transfer with Min A and LRAD OT Short Term Goal 4 - Progress (Week 1): Met OT Short Term Goal 5 (Week 1): Pt will utilize R UE during ADL routine with min multimodal cues OT Short Term Goal 5 - Progress (Week 1): Met Week 2:  OT Short Term Goal 1 (Week 2): Pt will complete shower transfers with RW vs PFRW with supervision OT Short Term Goal 2 (Week 2): Pt will complete toilet transfer with RW vs PFRW with supervision OT Short Term Goal 3 (Week 2): Pt will complete 2 grooming tasks in standing with supervision  Skilled Therapeutic Interventions/Progress Updates:     Focus on activity tolerance, sit<>stand, functional transfers, standing balance, safety awareness to increase independence with BADLs.   Ppt performed all mobility with RW platform and min assist for balance.  Transferred to toilet, shower, bed, sofa and transferred items in kitchen.  PPt performed with min assist.   Propelled wc to room with increased time.   Therapy Documentation Precautions:  Precautions Precautions: Fall Precaution Comments: Absent R sided proprioception, severe OA R knee-don knee support brace Required Braces or Orthoses: Sling (for R UE support when OOB) Restrictions Weight Bearing Restrictions: No Pain:  none   ADL: ADL ADL Comments:  Please see functional navigator for ADL status Exercises:   Other Treatments:    See Function Navigator for Current Functional Status.   Therapy/Group: Group Therapy  Lisa Roca 11/25/2016, 3:24 PM

## 2016-11-25 NOTE — Progress Notes (Signed)
Subjective/Complaints: Pt seen sitting up in his chair this AM.  He states he slept until 3AM in his wheelchair and has been up since.    Review of systems: Denies CP, SOB, N/V/D.  Objective: Vital Signs: Blood pressure 132/70, pulse 79, temperature 98 F (36.7 C), temperature source Oral, resp. rate 18, weight 108.9 kg (240 lb 1.3 oz), SpO2 98 %. No results found. Results for orders placed or performed during the hospital encounter of 11/17/16 (from the past 72 hour(s))  Glucose, capillary     Status: Abnormal   Collection Time: 11/22/16 11:38 AM  Result Value Ref Range   Glucose-Capillary 101 (H) 65 - 99 mg/dL  Glucose, capillary     Status: Abnormal   Collection Time: 11/22/16  4:54 PM  Result Value Ref Range   Glucose-Capillary 100 (H) 65 - 99 mg/dL   Comment 1 Notify RN   Glucose, capillary     Status: Abnormal   Collection Time: 11/22/16  8:45 PM  Result Value Ref Range   Glucose-Capillary 197 (H) 65 - 99 mg/dL   Comment 1 Notify RN   CBC     Status: None   Collection Time: 11/23/16  5:58 AM  Result Value Ref Range   WBC 6.5 4.0 - 10.5 K/uL   RBC 4.75 4.22 - 5.81 MIL/uL   Hemoglobin 14.9 13.0 - 17.0 g/dL   HCT 41.9 39.0 - 52.0 %   MCV 88.2 78.0 - 100.0 fL   MCH 31.4 26.0 - 34.0 pg   MCHC 35.6 30.0 - 36.0 g/dL   RDW 12.0 11.5 - 15.5 %   Platelets 306 150 - 400 K/uL  Basic metabolic panel     Status: Abnormal   Collection Time: 11/23/16  5:58 AM  Result Value Ref Range   Sodium 132 (L) 135 - 145 mmol/L   Potassium 3.5 3.5 - 5.1 mmol/L   Chloride 98 (L) 101 - 111 mmol/L   CO2 25 22 - 32 mmol/L   Glucose, Bld 109 (H) 65 - 99 mg/dL   BUN 21 (H) 6 - 20 mg/dL   Creatinine, Ser 0.94 0.61 - 1.24 mg/dL   Calcium 9.0 8.9 - 10.3 mg/dL   GFR calc non Af Amer >60 >60 mL/min   GFR calc Af Amer >60 >60 mL/min    Comment: (NOTE) The eGFR has been calculated using the CKD EPI equation. This calculation has not been validated in all clinical situations. eGFR's persistently  <60 mL/min signify possible Chronic Kidney Disease.    Anion gap 9 5 - 15  Glucose, capillary     Status: Abnormal   Collection Time: 11/23/16  6:37 AM  Result Value Ref Range   Glucose-Capillary 113 (H) 65 - 99 mg/dL   Comment 1 Notify RN   Glucose, capillary     Status: Abnormal   Collection Time: 11/23/16 12:29 PM  Result Value Ref Range   Glucose-Capillary 195 (H) 65 - 99 mg/dL  Glucose, capillary     Status: None   Collection Time: 11/23/16  5:47 PM  Result Value Ref Range   Glucose-Capillary 97 65 - 99 mg/dL  Glucose, capillary     Status: Abnormal   Collection Time: 11/23/16  8:53 PM  Result Value Ref Range   Glucose-Capillary 155 (H) 65 - 99 mg/dL  Glucose, capillary     Status: Abnormal   Collection Time: 11/24/16  7:01 AM  Result Value Ref Range   Glucose-Capillary 106 (H) 65 - 99  mg/dL  Glucose, capillary     Status: Abnormal   Collection Time: 11/24/16 12:17 PM  Result Value Ref Range   Glucose-Capillary 157 (H) 65 - 99 mg/dL   Comment 1 Notify RN   Glucose, capillary     Status: None   Collection Time: 11/24/16  4:55 PM  Result Value Ref Range   Glucose-Capillary 95 65 - 99 mg/dL  Glucose, capillary     Status: Abnormal   Collection Time: 11/24/16  8:55 PM  Result Value Ref Range   Glucose-Capillary 105 (H) 65 - 99 mg/dL   Comment 1 Notify RN   Glucose, capillary     Status: None   Collection Time: 11/25/16  6:29 AM  Result Value Ref Range   Glucose-Capillary 87 65 - 99 mg/dL   Comment 1 Notify RN      General: No acute distress. Vital signs reviewed.  Psych: Mood and affect are appropriate Heart: Regular rate and rhythm. No JVD.  Lungs: Clear to auscultation, breathing unlabored Abdomen: Positive bowel sounds, soft  Skin: Intact. Warm and dry. Neurologic:  Motor: B/l UE 5/5 deltoid, bicep, tricep, grip, hip flexor, knee extensors, ankle dorsiflexor and plantar flexor, RUE slightly limited due to pain RUE Dysmetria noted.  Musculoskeletal: No edema.  No tenderness.   Assessment/Plan: 1. 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 Physiatrist is providing close team supervision and 24 hour management of active medical problems listed below. Physiatrist and rehab team continue to assess barriers to discharge/monitor patient progress toward functional and medical goals. FIM: Function - Bathing Position: Shower Body parts bathed by patient: Right arm, Left arm, Chest, Abdomen, Front perineal area, Right upper leg, Left upper leg, Right lower leg, Left lower leg, Buttocks Body parts bathed by helper: Back Bathing not applicable: Back Assist Level: Supervision or verbal cues  Function- Upper Body Dressing/Undressing What is the patient wearing?: Pull over shirt/dress Pull over shirt/dress - Perfomed by patient: Thread/unthread right sleeve, Thread/unthread left sleeve, Put head through opening, Pull shirt over trunk Assist Level: More than reasonable time Function - Lower Body Dressing/Undressing What is the patient wearing?: Underwear, Pants, Socks, Shoes Position: Wheelchair/chair at [employer] - Performed by patient: Thread/unthread right underwear leg, Thread/unthread left underwear leg, Pull underwear up/down Underwear - Performed by helper: Pull underwear up/down Pants- Performed by patient: Thread/unthread right pants leg, Thread/unthread left pants leg, Pull pants up/down Pants- Performed by helper: Pull pants up/down Socks - Performed by patient: Don/doff left sock Socks - Performed by helper: Don/doff right sock, Don/doff left sock Shoes - Performed by patient: Don/doff right shoe, Don/doff left shoe Shoes - Performed by helper: Don/doff right shoe, Don/doff left shoe, Fasten right, Fasten left Assist for footwear: Maximal assist Assist for lower body dressing: Touching or  steadying assistance (Pt > 75%)  Function - Toileting Toileting steps completed by patient: Adjust clothing prior to toileting, Performs perineal hygiene, Adjust clothing after toileting Toileting steps completed by helper: Adjust clothing prior to toileting, Performs perineal hygiene, Adjust clothing after toileting Toileting Assistive Devices: Grab bar or rail Assist level: Touching or steadying assistance (Pt.75%)  Function - Air cabin crew transfer assistive device: Elevated toilet seat/BSC over toilet, Grab bar Assist level to toilet: Supervision or verbal cues Assist level from toilet: Supervision or verbal cues  Function - Chair/bed transfer Chair/bed transfer method: Stand pivot Chair/bed transfer assist level: Touching or  steadying assistance (Pt > 75%) Chair/bed transfer assistive device: Armrests Chair/bed transfer details: Verbal cues for technique, Verbal cues for sequencing, Verbal cues for precautions/safety, Tactile cues for placement  Function - Locomotion: Wheelchair Type: Manual Max wheelchair distance: 150 Assist Level: Supervision or verbal cues Assist Level: Supervision or verbal cues Assist Level: Supervision or verbal cues Turns around,maneuvers to table,bed, and toilet,negotiates 3% grade,maneuvers on rugs and over doorsills: No Function - Locomotion: Ambulation Assistive device: Walker-platform Max distance: 170 Assist level: Supervision or verbal cues Assist level: Supervision or verbal cues Walk 50 feet with 2 turns activity did not occur: Safety/medical concerns Assist level: Supervision or verbal cues Walk 150 feet activity did not occur: Safety/medical concerns Assist level: Supervision or verbal cues Walk 10 feet on uneven surfaces activity did not occur: Safety/medical concerns  Function - Comprehension Comprehension: Auditory Comprehension assist level: Follows complex conversation/direction with extra time/assistive device  Function  - Expression Expression: Verbal Expression assist level: Expresses basic needs/ideas: With extra time/assistive device  Function - Social Interaction Social Interaction assist level: Interacts appropriately with others - No medications needed.  Function - Problem Solving Problem solving assist level: Solves complex problems: With extra time  Function - Memory Memory assist level: More than reasonable amount of time Patient normally able to recall (first 3 days only): Current season, Location of own room, Staff names and faces, That he or she is in a hospital  Medical Problem List and Plan: 1.  Functional and mobility deficits secondary to left thalamic hemorrhage  Continue CIR   MRI demonstrating left thalamic hemorrhage, also involving corona radiata, no additional areas of infarct noted,  Carotid Dopplers, negative, recommending follow-up MRI in ~3 months. 2.  DVT Prophylaxis/Anticoagulation: Pharmaceutical: Lovenox 3. Pain Management:  Will add tramadol prn for knee pain/shoulder pain, has evidence of glenohumeral arthritis but also may have some rotator cuff degeneration discussed potential for steroid injection. He does not wish to proceed with this at the current time. We will ask PT, OT. to Trial FES, added Kpad 4. Mood: Team to provide ego support. Under lot of stress due to business commitments/end of year. LCSW to follow for evaluation and support.  5. Neuropsych: This patient is capable of making decisions on his own behalf. 6. Skin/Wound Care: Routine pressure relief measures. Maintain adequate nutritional and hydration status.  7. Fluids/Electrolytes/Nutrition: Monitor I/O.  8. HTN: Monitor BP bid--on HCTZ and cozaar.   Overall controlled 12/23 9. 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              -consider supportive/OA knee brace for support  11. Prediabetes: Hgb  A1c- 6.3. Added CM restrictions.   Consult RD to educate patient on diet.   Overall controlled 12/23 12. Hyponatremia  Labs ordered for tomorrow  LOS (Days) 8 A FACE TO FACE EVALUATION WAS PERFORMED  Merril Isakson Lorie Phenix 11/25/2016, 9:58 AM

## 2016-11-25 NOTE — Progress Notes (Signed)
Occupational Therapy Session Note  Patient Details  Name: [name] MRN: [id number] Date of Birth: January 30, 1951   Today's Date: 11/25/2016 OT Group Time:  -   1000-1100  (60 min)       Short Term Goals: Week 1:  OT Short Term Goal 1 (Week 1): Pt will complete toilet transfers and LRAD with Min A OT Short Term Goal 1 - Progress (Week 1): Met OT Short Term Goal 2 (Week 1): Pt will complete LB dressing with Max A OT Short Term Goal 2 - Progress (Week 1): Met OT Short Term Goal 3 (Week 1): Pt will complete bathing with Min A OT Short Term Goal 3 - Progress (Week 1): Met OT Short Term Goal 4 (Week 1): Pt will complete shower transfer with Min A and LRAD OT Short Term Goal 4 - Progress (Week 1): Met OT Short Term Goal 5 (Week 1): Pt will utilize R UE during ADL routine with min multimodal cues OT Short Term Goal 5 - Progress (Week 1): Met Week 2:  OT Short Term Goal 1 (Week 2): Pt will complete shower transfers with RW vs PFRW with supervision OT Short Term Goal 2 (Week 2): Pt will complete toilet transfer with RW vs PFRW with supervision OT Short Term Goal 3 (Week 2): Pt will complete 2 grooming tasks in standing with supervision  Skilled Therapeutic Interventions/Progress Updates:     Focus on activity tolerance, sit<>stand, functional transfers, standing balance, BADL retraining, RUE functional use, and safety awareness to increase independence with BADLs. Performed transfers to toilet, shower seat in stall, bed, sofa and balance with passing items in kitchen.  Used platform walker for mobility.     Pt was min assist for balance.  Needed mod cues for hand placement with sit to stand and stand to sit.  Propelled wc back to room at end of session.   Therapy Documentation Precautions:  Precautions Precautions: Fall Precaution Comments: Absent R sided proprioception, severe OA R knee-don knee support brace Required Braces or Orthoses: Sling (for R UE support when  OOB) Restrictions Weight Bearing Restrictions: No :      Pain:  none   ADL: ADL ADL Comments: Please see functional navigator for ADL status     See Function Navigator for Current Functional Status.   Therapy/Group: Group Therapy  Lisa Roca 11/25/2016, 3:26 PM

## 2016-11-26 LAB — BASIC METABOLIC PANEL
ANION GAP: 10 (ref 5–15)
BUN: 15 mg/dL (ref 6–20)
CALCIUM: 8.9 mg/dL (ref 8.9–10.3)
CO2: 27 mmol/L (ref 22–32)
Chloride: 97 mmol/L — ABNORMAL LOW (ref 101–111)
Creatinine, Ser: 0.91 mg/dL (ref 0.61–1.24)
Glucose, Bld: 105 mg/dL — ABNORMAL HIGH (ref 65–99)
Potassium: 3.6 mmol/L (ref 3.5–5.1)
Sodium: 134 mmol/L — ABNORMAL LOW (ref 135–145)

## 2016-11-26 LAB — GLUCOSE, CAPILLARY
GLUCOSE-CAPILLARY: 120 mg/dL — AB (ref 65–99)
Glucose-Capillary: 111 mg/dL — ABNORMAL HIGH (ref 65–99)
Glucose-Capillary: 112 mg/dL — ABNORMAL HIGH (ref 65–99)

## 2016-11-26 NOTE — Progress Notes (Signed)
Subjective/Complaints: Pt seen sitting up in his chair this AM.  He states he slept better in the bed overnight and was less anxious.  He was questions about therapies today.   Review of systems: Denies CP, SOB, N/V/D.  Objective: Vital Signs: Blood pressure (!) 144/69, pulse 63, temperature 98.3 F (36.8 C), temperature source Oral, resp. rate 17, weight 108.6 kg (239 lb 6.7 oz), SpO2 97 %. No results found. Results for orders placed or performed during the hospital encounter of 11/17/16 (from the past 72 hour(s))  Glucose, capillary     Status: Abnormal   Collection Time: 11/23/16 12:29 PM  Result Value Ref Range   Glucose-Capillary 195 (H) 65 - 99 mg/dL  Glucose, capillary     Status: None   Collection Time: 11/23/16  5:47 PM  Result Value Ref Range   Glucose-Capillary 97 65 - 99 mg/dL  Glucose, capillary     Status: Abnormal   Collection Time: 11/23/16  8:53 PM  Result Value Ref Range   Glucose-Capillary 155 (H) 65 - 99 mg/dL  Glucose, capillary     Status: Abnormal   Collection Time: 11/24/16  7:01 AM  Result Value Ref Range   Glucose-Capillary 106 (H) 65 - 99 mg/dL  Glucose, capillary     Status: Abnormal   Collection Time: 11/24/16 12:17 PM  Result Value Ref Range   Glucose-Capillary 157 (H) 65 - 99 mg/dL   Comment 1 Notify RN   Glucose, capillary     Status: None   Collection Time: 11/24/16  4:55 PM  Result Value Ref Range   Glucose-Capillary 95 65 - 99 mg/dL  Glucose, capillary     Status: Abnormal   Collection Time: 11/24/16  8:55 PM  Result Value Ref Range   Glucose-Capillary 105 (H) 65 - 99 mg/dL   Comment 1 Notify RN   Glucose, capillary     Status: None   Collection Time: 11/25/16  6:29 AM  Result Value Ref Range   Glucose-Capillary 87 65 - 99 mg/dL   Comment 1 Notify RN   Glucose, capillary     Status: Abnormal   Collection Time: 11/25/16 11:29 AM  Result Value Ref Range   Glucose-Capillary 119 (H) 65 - 99 mg/dL   Comment 1 Notify RN   Glucose,  capillary     Status: Abnormal   Collection Time: 11/25/16  4:29 PM  Result Value Ref Range   Glucose-Capillary 104 (H) 65 - 99 mg/dL  Glucose, capillary     Status: Abnormal   Collection Time: 11/25/16  8:56 PM  Result Value Ref Range   Glucose-Capillary 127 (H) 65 - 99 mg/dL  Basic metabolic panel     Status: Abnormal   Collection Time: 11/26/16  5:37 AM  Result Value Ref Range   Sodium 134 (L) 135 - 145 mmol/L   Potassium 3.6 3.5 - 5.1 mmol/L   Chloride 97 (L) 101 - 111 mmol/L   CO2 27 22 - 32 mmol/L   Glucose, Bld 105 (H) 65 - 99 mg/dL   BUN 15 6 - 20 mg/dL   Creatinine, Ser 0.91 0.61 - 1.24 mg/dL   Calcium 8.9 8.9 - 10.3 mg/dL   GFR calc non Af Amer >60 >60 mL/min   GFR calc Af Amer >60 >60 mL/min    Comment: (NOTE) The eGFR has been calculated using the CKD EPI equation. This calculation has not been validated in all clinical situations. eGFR's persistently <60 mL/min signify possible Chronic Kidney  Disease.    Anion gap 10 5 - 15  Glucose, capillary     Status: Abnormal   Collection Time: 11/26/16  6:39 AM  Result Value Ref Range   Glucose-Capillary 111 (H) 65 - 99 mg/dL     General: NAD. Vital signs reviewed.  Psych: Mood and affect are appropriate Heart: RRR. No JVD.  Lungs: Clear to auscultation, breathing unlabored Abdomen: Positive bowel sounds, soft  Skin: Intact. Warm and dry. Neurologic:  Motor: B/l UE 5/5 deltoid, bicep, tricep, grip, hip flexor, knee extensors, ankle dorsiflexor and plantar flexor, proximal RUE slightly limited due to pain RUE Dysmetria noted.  Musculoskeletal: No edema. No tenderness.   Assessment/Plan: 1. 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 Physiatrist is providing close team supervision and 24 hour management of active medical problems listed below. Physiatrist and rehab team  continue to assess barriers to discharge/monitor patient progress toward functional and medical goals. FIM: Function - Bathing Position: Shower Body parts bathed by patient: Right arm, Left arm, Chest, Abdomen, Front perineal area, Right upper leg, Left upper leg, Right lower leg, Left lower leg, Buttocks Body parts bathed by helper: Back Bathing not applicable: Back Assist Level: Supervision or verbal cues  Function- Upper Body Dressing/Undressing What is the patient wearing?: Pull over shirt/dress Pull over shirt/dress - Perfomed by patient: Thread/unthread right sleeve, Thread/unthread left sleeve, Put head through opening, Pull shirt over trunk Assist Level: More than reasonable time Function - Lower Body Dressing/Undressing What is the patient wearing?: Underwear, Pants, Socks, Shoes Position: Wheelchair/chair at [employer] - Performed by patient: Thread/unthread right underwear leg, Thread/unthread left underwear leg, Pull underwear up/down Underwear - Performed by helper: Pull underwear up/down Pants- Performed by patient: Thread/unthread right pants leg, Thread/unthread left pants leg, Pull pants up/down Pants- Performed by helper: Pull pants up/down Socks - Performed by patient: Don/doff left sock Socks - Performed by helper: Don/doff right sock, Don/doff left sock Shoes - Performed by patient: Don/doff right shoe, Don/doff left shoe Shoes - Performed by helper: Don/doff right shoe, Don/doff left shoe, Fasten right, Fasten left Assist for footwear: Maximal assist Assist for lower body dressing: Touching or steadying assistance (Pt > 75%)  Function - Toileting Toileting activity did not occur: No continent bowel/bladder event (using urinal, no BM) Toileting steps completed by patient: Adjust clothing prior to toileting, Performs perineal hygiene, Adjust clothing after toileting Toileting steps completed by helper: Adjust clothing prior to toileting, Performs perineal  hygiene, Adjust clothing after toileting Toileting Assistive Devices: Grab bar or rail Assist level: Touching or steadying assistance (Pt.75%)  Function - Air cabin crew transfer activity did not occur:  (using urinal, No BM) Toilet transfer assistive device: Elevated toilet seat/BSC over toilet, Grab bar Assist level to toilet: Supervision or verbal cues Assist level from toilet: Supervision or verbal cues  Function - Chair/bed transfer Chair/bed transfer method: Stand pivot Chair/bed transfer assist level: Touching or steadying assistance (Pt > 75%) Chair/bed transfer assistive device: Armrests Chair/bed transfer details: Verbal cues for technique, Verbal cues for sequencing, Verbal cues for precautions/safety, Tactile cues for placement  Function - Locomotion: Wheelchair Type: Manual Max wheelchair distance: 150 Assist Level: Supervision or verbal cues Assist Level: Supervision or verbal cues Assist Level: Supervision or verbal cues Turns around,maneuvers to table,bed, and toilet,negotiates 3% grade,maneuvers on rugs and over doorsills: No Function - Locomotion: Ambulation Assistive device: Walker-platform Max distance: 170  Assist level: Supervision or verbal cues Assist level: Supervision or verbal cues Walk 50 feet with 2 turns activity did not occur: Safety/medical concerns Assist level: Supervision or verbal cues Walk 150 feet activity did not occur: Safety/medical concerns Assist level: Supervision or verbal cues Walk 10 feet on uneven surfaces activity did not occur: Safety/medical concerns  Function - Comprehension Comprehension: Auditory Comprehension assist level: Follows complex conversation/direction with extra time/assistive device  Function - Expression Expression: Verbal Expression assist level: Expresses complex ideas: With no assist  Function - Social Interaction Social Interaction assist level: Interacts appropriately with others - No  medications needed.  Function - Problem Solving Problem solving assist level: Solves complex problems: With extra time  Function - Memory Memory assist level: More than reasonable amount of time Patient normally able to recall (first 3 days only): Current season, Location of own room, Staff names and faces, That he or she is in a hospital  Medical Problem List and Plan: 1.  Functional and mobility deficits secondary to left thalamic hemorrhage  Continue CIR   MRI demonstrating left thalamic hemorrhage, also involving corona radiata, no additional areas of infarct noted,  Carotid Dopplers, negative, recommending follow-up MRI in ~3 months. 2.  DVT Prophylaxis/Anticoagulation: Pharmaceutical: Lovenox 3. Pain Management:  Will add tramadol prn for knee pain/shoulder pain, has evidence of glenohumeral arthritis but also may have some rotator cuff degeneration discussed potential for steroid injection. He does not wish to proceed with this at the current time. We will ask PT, OT. to Trial FES, added Kpad 4. Mood: Team to provide ego support. Under lot of stress due to business commitments/end of year. LCSW to follow for evaluation and support.  5. Neuropsych: This patient is capable of making decisions on his own behalf. 6. Skin/Wound Care: Routine pressure relief measures. Maintain adequate nutritional and hydration status.  7. Fluids/Electrolytes/Nutrition: Monitor I/O.  8. HTN: Monitor BP bid--on HCTZ and cozaar.   Slightly labile, overall controlled 12/24 9. 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              -consider supportive/OA knee brace for support  11. Prediabetes: Hgb A1c- 6.3. Added CM restrictions.   Consult RD to educate patient on diet.   Overall controlled 12/24 12. Hyponatremia  Na+ 134 on 12/24  Controlled  LOS (Days) 9 A FACE TO FACE EVALUATION WAS  PERFORMED  Makya Phillis Lorie Phenix 11/26/2016, 9:00 AM

## 2016-11-27 LAB — VAS US CAROTID
LCCADDIAS: 19 cm/s
LEFT ECA DIAS: -14 cm/s
LEFT VERTEBRAL DIAS: 15 cm/s
LICADDIAS: -27 cm/s
LICADSYS: -80 cm/s
LICAPDIAS: 11 cm/s
LICAPSYS: 66 cm/s
Left CCA dist sys: 77 cm/s
Left CCA prox dias: 23 cm/s
Left CCA prox sys: 110 cm/s
RIGHT ECA DIAS: -13 cm/s
RIGHT VERTEBRAL DIAS: -20 cm/s
Right CCA prox dias: 24 cm/s
Right CCA prox sys: 122 cm/s
Right cca dist sys: -108 cm/s

## 2016-11-27 LAB — GLUCOSE, CAPILLARY
GLUCOSE-CAPILLARY: 99 mg/dL (ref 65–99)
GLUCOSE-CAPILLARY: 93 mg/dL (ref 65–99)
GLUCOSE-CAPILLARY: 103 mg/dL — AB (ref 65–99)
GLUCOSE-CAPILLARY: 131 mg/dL — AB (ref 65–99)

## 2016-11-27 NOTE — Progress Notes (Signed)
Subjective/Complaints: Pt sitting up in his chair this AM.  He states he does not have therapies, but plans to stay active, so he does not get lazy.   Review of systems: Denies CP, SOB, N/V/D.  Objective: Vital Signs: Blood pressure 123/63, pulse 89, temperature 98.3 F (36.8 C), temperature source Oral, resp. rate 18, weight 113.1 kg (249 lb 4.8 oz), SpO2 98 %. No results found. Results for orders placed or performed during the hospital encounter of 11/17/16 (from the past 72 hour(s))  Glucose, capillary     Status: Abnormal   Collection Time: 11/24/16 12:17 PM  Result Value Ref Range   Glucose-Capillary 157 (H) 65 - 99 mg/dL   Comment 1 Notify RN   Glucose, capillary     Status: None   Collection Time: 11/24/16  4:55 PM  Result Value Ref Range   Glucose-Capillary 95 65 - 99 mg/dL  Glucose, capillary     Status: Abnormal   Collection Time: 11/24/16  8:55 PM  Result Value Ref Range   Glucose-Capillary 105 (H) 65 - 99 mg/dL   Comment 1 Notify RN   Glucose, capillary     Status: None   Collection Time: 11/25/16  6:29 AM  Result Value Ref Range   Glucose-Capillary 87 65 - 99 mg/dL   Comment 1 Notify RN   Glucose, capillary     Status: Abnormal   Collection Time: 11/25/16 11:29 AM  Result Value Ref Range   Glucose-Capillary 119 (H) 65 - 99 mg/dL   Comment 1 Notify RN   Glucose, capillary     Status: Abnormal   Collection Time: 11/25/16  4:29 PM  Result Value Ref Range   Glucose-Capillary 104 (H) 65 - 99 mg/dL  Glucose, capillary     Status: Abnormal   Collection Time: 11/25/16  8:56 PM  Result Value Ref Range   Glucose-Capillary 127 (H) 65 - 99 mg/dL  Basic metabolic panel     Status: Abnormal   Collection Time: 11/26/16  5:37 AM  Result Value Ref Range   Sodium 134 (L) 135 - 145 mmol/L   Potassium 3.6 3.5 - 5.1 mmol/L   Chloride 97 (L) 101 - 111 mmol/L   CO2 27 22 - 32 mmol/L   Glucose, Bld 105 (H) 65 - 99 mg/dL   BUN 15 6 - 20 mg/dL   Creatinine, Ser 0.91 0.61 -  1.24 mg/dL   Calcium 8.9 8.9 - 10.3 mg/dL   GFR calc non Af Amer >60 >60 mL/min   GFR calc Af Amer >60 >60 mL/min    Comment: (NOTE) The eGFR has been calculated using the CKD EPI equation. This calculation has not been validated in all clinical situations. eGFR's persistently <60 mL/min signify possible Chronic Kidney Disease.    Anion gap 10 5 - 15  Glucose, capillary     Status: Abnormal   Collection Time: 11/26/16  6:39 AM  Result Value Ref Range   Glucose-Capillary 111 (H) 65 - 99 mg/dL  Glucose, capillary     Status: Abnormal   Collection Time: 11/26/16  4:42 PM  Result Value Ref Range   Glucose-Capillary 112 (H) 65 - 99 mg/dL  Glucose, capillary     Status: Abnormal   Collection Time: 11/26/16  8:24 PM  Result Value Ref Range   Glucose-Capillary 120 (H) 65 - 99 mg/dL  Glucose, capillary     Status: None   Collection Time: 11/27/16  6:33 AM  Result Value Ref Range  Glucose-Capillary 99 65 - 99 mg/dL     General: NAD. Vital signs reviewed.  Psych: Mood and affect are appropriate Heart: RRR. No JVD.  Lungs: Clear to auscultation, breathing unlabored Abdomen: Positive bowel sounds, soft  Skin: Intact. Warm and dry. Neurologic:  Motor: B/l UE 5/5 deltoid, bicep, tricep, grip, hip flexor, knee extensors, ankle dorsiflexor and plantar flexor, proximal RUE slightly limited due to pain (unchanged) RUE Dysmetria noted.  Musculoskeletal: No edema. No tenderness.   Assessment/Plan: 1. 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 Physiatrist is providing close team supervision and 24 hour management of active medical problems listed below. Physiatrist and rehab team continue to assess barriers to discharge/monitor patient progress toward functional and medical goals. FIM: Function - Bathing Position: Shower Body parts bathed by  patient: Right arm, Left arm, Chest, Abdomen, Front perineal area, Right upper leg, Left upper leg, Right lower leg, Left lower leg, Buttocks Body parts bathed by helper: Back Bathing not applicable: Back Assist Level: Supervision or verbal cues  Function- Upper Body Dressing/Undressing What is the patient wearing?: Pull over shirt/dress Pull over shirt/dress - Perfomed by patient: Thread/unthread right sleeve, Thread/unthread left sleeve, Put head through opening, Pull shirt over trunk Assist Level: More than reasonable time Function - Lower Body Dressing/Undressing What is the patient wearing?: Underwear, Pants, Socks, Shoes Position: Wheelchair/chair at [employer] - Performed by patient: Thread/unthread right underwear leg, Thread/unthread left underwear leg, Pull underwear up/down Underwear - Performed by helper: Pull underwear up/down Pants- Performed by patient: Thread/unthread right pants leg, Thread/unthread left pants leg, Pull pants up/down Pants- Performed by helper: Pull pants up/down Socks - Performed by patient: Don/doff left sock Socks - Performed by helper: Don/doff right sock, Don/doff left sock Shoes - Performed by patient: Don/doff right shoe, Don/doff left shoe Shoes - Performed by helper: Don/doff right shoe, Don/doff left shoe, Fasten right, Fasten left Assist for footwear: Maximal assist Assist for lower body dressing: Touching or steadying assistance (Pt > 75%)  Function - Toileting Toileting activity did not occur: No continent bowel/bladder event (using urinal, no BM) Toileting steps completed by patient: Adjust clothing prior to toileting, Adjust clothing after toileting Toileting steps completed by helper: Adjust clothing prior to toileting, Performs perineal hygiene, Adjust clothing after toileting Toileting Assistive Devices: Grab bar or rail Assist level: Touching or steadying assistance (Pt.75%)  Function - Air cabin crew transfer activity  did not occur:  (using urinal, No BM) Toilet transfer assistive device: Elevated toilet seat/BSC over toilet, Grab bar Assist level to toilet: Supervision or verbal cues Assist level from toilet: Supervision or verbal cues  Function - Chair/bed transfer Chair/bed transfer method: Stand pivot Chair/bed transfer assist level: Touching or steadying assistance (Pt > 75%) Chair/bed transfer assistive device: Armrests Chair/bed transfer details: Verbal cues for technique, Verbal cues for sequencing, Verbal cues for precautions/safety, Tactile cues for placement  Function - Locomotion: Wheelchair Type: Manual Max wheelchair distance: 150 Assist Level: Supervision or verbal cues Assist Level: Supervision or verbal cues Assist Level: Supervision or verbal cues Turns around,maneuvers to table,bed, and toilet,negotiates 3% grade,maneuvers on rugs and over doorsills: No Function - Locomotion: Ambulation Assistive device: Walker-platform Max distance: 170 Assist level: Supervision or verbal cues Assist level: Supervision or verbal cues Walk 50 feet with 2 turns activity did not occur: Safety/medical concerns Assist level: Supervision or verbal cues Walk 150 feet activity did not occur:  Safety/medical concerns Assist level: Supervision or verbal cues Walk 10 feet on uneven surfaces activity did not occur: Safety/medical concerns  Function - Comprehension Comprehension: Auditory Comprehension assist level: Follows complex conversation/direction with extra time/assistive device  Function - Expression Expression: Verbal Expression assist level: Expresses complex ideas: With no assist  Function - Social Interaction Social Interaction assist level: Interacts appropriately with others - No medications needed.  Function - Problem Solving Problem solving assist level: Solves complex problems: With extra time  Function - Memory Memory assist level: More than reasonable amount of time Patient  normally able to recall (first 3 days only): Current season, Location of own room, Staff names and faces, That he or she is in a hospital  Medical Problem List and Plan: 1.  Functional and mobility deficits secondary to left thalamic hemorrhage  Continue CIR   MRI demonstrating left thalamic hemorrhage, also involving corona radiata, no additional areas of infarct noted,  Carotid Dopplers, negative, recommending follow-up MRI in ~3 months. 2.  DVT Prophylaxis/Anticoagulation: Pharmaceutical: Lovenox 3. Pain Management:  Will add tramadol prn for knee pain/shoulder pain, has evidence of glenohumeral arthritis but also may have some rotator cuff degeneration discussed potential for steroid injection. He does not wish to proceed with this at the current time. We will ask PT, OT. to Trial FES, added Kpad 4. Mood: Team to provide ego support. Under lot of stress due to business commitments/end of year. LCSW to follow for evaluation and support.  5. Neuropsych: This patient is capable of making decisions on his own behalf. 6. Skin/Wound Care: Routine pressure relief measures. Maintain adequate nutritional and hydration status.  7. Fluids/Electrolytes/Nutrition: Monitor I/O.  8. HTN: Monitor BP bid--on HCTZ and cozaar.   Controlled 12/25 9. 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              -consider supportive/OA knee brace for support  11. Prediabetes: Hgb A1c- 6.3. Added CM restrictions.   Consult RD to educate patient on diet.   Controlled 12/25 12. Hyponatremia  Na+ 134 on 12/24  Controlled  LOS (Days) 10 A FACE TO FACE EVALUATION WAS PERFORMED  [name] Lorie Phenix 11/27/2016, 8:55 AM

## 2016-11-27 NOTE — Plan of Care (Signed)
Problem: RH KNOWLEDGE DEFICIT Goal: RH STG INCREASE KNOWLEDGE OF HYPERTENSION PT will be able to manage HTN with cues/resources for HH, CMM and medication control. Pt will be able to state medications prescribed and schedule for medications with cues  Outcome: Progressing Charted "not progressing' by accident.

## 2016-11-28 ENCOUNTER — Inpatient Hospital Stay (HOSPITAL_COMMUNITY): Payer: PPO | Admitting: Occupational Therapy

## 2016-11-28 ENCOUNTER — Inpatient Hospital Stay (HOSPITAL_COMMUNITY): Payer: PPO | Admitting: Physical Therapy

## 2016-11-28 DIAGNOSIS — E669 Obesity, unspecified: Secondary | ICD-10-CM | POA: Diagnosis not present

## 2016-11-28 DIAGNOSIS — R079 Chest pain, unspecified: Secondary | ICD-10-CM | POA: Diagnosis not present

## 2016-11-28 DIAGNOSIS — R209 Unspecified disturbances of skin sensation: Secondary | ICD-10-CM

## 2016-11-28 DIAGNOSIS — I10 Essential (primary) hypertension: Secondary | ICD-10-CM | POA: Diagnosis not present

## 2016-11-28 DIAGNOSIS — I69398 Other sequelae of cerebral infarction: Secondary | ICD-10-CM

## 2016-11-28 DIAGNOSIS — E119 Type 2 diabetes mellitus without complications: Secondary | ICD-10-CM | POA: Diagnosis not present

## 2016-11-28 LAB — CBC
HCT: 39.4 % (ref 39.0–52.0)
HEMOGLOBIN: 13.6 g/dL (ref 13.0–17.0)
MCH: 30.8 pg (ref 26.0–34.0)
MCHC: 34.5 g/dL (ref 30.0–36.0)
MCV: 89.3 fL (ref 78.0–100.0)
PLATELETS: 274 10*3/uL (ref 150–400)
RBC: 4.41 MIL/uL (ref 4.22–5.81)
RDW: 12 % (ref 11.5–15.5)
WBC: 6 10*3/uL (ref 4.0–10.5)

## 2016-11-28 LAB — GLUCOSE, CAPILLARY
GLUCOSE-CAPILLARY: 115 mg/dL — AB (ref 65–99)
GLUCOSE-CAPILLARY: 78 mg/dL (ref 65–99)
GLUCOSE-CAPILLARY: 100 mg/dL — AB (ref 65–99)
Glucose-Capillary: 92 mg/dL (ref 65–99)

## 2016-11-28 LAB — BASIC METABOLIC PANEL
ANION GAP: 8 (ref 5–15)
BUN: 15 mg/dL (ref 6–20)
CALCIUM: 9 mg/dL (ref 8.9–10.3)
CO2: 29 mmol/L (ref 22–32)
CREATININE: 1.03 mg/dL (ref 0.61–1.24)
Chloride: 97 mmol/L — ABNORMAL LOW (ref 101–111)
Glucose, Bld: 99 mg/dL (ref 65–99)
Potassium: 3.7 mmol/L (ref 3.5–5.1)
SODIUM: 134 mmol/L — AB (ref 135–145)

## 2016-11-28 NOTE — Progress Notes (Signed)
Physical Therapy Session Note  Patient Details  Name: [name] MRN: [id number] Date of Birth: 12/02/1951  Today's Date: 11/28/2016 PT Individual Time: 0900-0958 PT Individual Time Calculation (min): 58 min    Short Term Goals: Week 2:  PT Short Term Goal 1 (Week 2): =LTGs due to ELOS  Skilled Therapeutic Interventions/Progress Updates:    Pt resting in w/c on arrival with no c/o pain and agreeable to therapy session.  Session focus on NMR via reciprocal stepping pattern, forced use NMR, w/c positioning, w/c propulsion, transfers, and gait training.  Pt performs squat/pivots to L side with supervision but requires steady assist and mod cues for squat/pivot to R side.    NMR via reciprocal stepping pattern with w/c propulsion x150' with BLEs and LUE.  [name] x10 minutes with 4 extremities at level 4 focus on forced use and attention to RUE grip.  NMR focus on RUE forced use during fine motor task at table top level.    PT switched out pt's w/c as he reports the R wheel is catching on the arm rest, switched back to tension back for improved sitting tolerance and posture.    Gait training x150' with PRF with close supervision, pt self corrects gait pattern with improved knee flexion in 60% of observed trials.  Pt continues to require occasional cues for improved RLE swing through.    Pt returned to room at end of session and positioned upright in w/c with call bell in reach and needs met.   Therapy Documentation Precautions:  Precautions Precautions: Fall Precaution Comments: Absent R sided proprioception, severe OA R knee-don knee support brace Required Braces or Orthoses: Sling (for R UE support when OOB) Restrictions Weight Bearing Restrictions: No   See Function Navigator for Current Functional Status.   Therapy/Group: Individual Therapy  Caitlin E Penven-Crew 11/28/2016, 9:50 AM  

## 2016-11-28 NOTE — Progress Notes (Signed)
Subjective/Complaints: Pt seen sitting up at the edge of his bed this AM.  He slept well and is ready to begin therapies again.   Review of systems: Denies CP, SOB, N/V/D.  Objective: Vital Signs: Blood pressure (!) 101/50, pulse 86, temperature 98.6 F (37 C), temperature source Oral, resp. rate 18, weight 113.1 kg (249 lb 4.8 oz), SpO2 97 %. No results found. Results for orders placed or performed during the hospital encounter of 11/17/16 (from the past 72 hour(s))  Glucose, capillary     Status: Abnormal   Collection Time: 11/25/16 11:29 AM  Result Value Ref Range   Glucose-Capillary 119 (H) 65 - 99 mg/dL   Comment 1 Notify RN   Glucose, capillary     Status: Abnormal   Collection Time: 11/25/16  4:29 PM  Result Value Ref Range   Glucose-Capillary 104 (H) 65 - 99 mg/dL  Glucose, capillary     Status: Abnormal   Collection Time: 11/25/16  8:56 PM  Result Value Ref Range   Glucose-Capillary 127 (H) 65 - 99 mg/dL  Basic metabolic panel     Status: Abnormal   Collection Time: 11/26/16  5:37 AM  Result Value Ref Range   Sodium 134 (L) 135 - 145 mmol/L   Potassium 3.6 3.5 - 5.1 mmol/L   Chloride 97 (L) 101 - 111 mmol/L   CO2 27 22 - 32 mmol/L   Glucose, Bld 105 (H) 65 - 99 mg/dL   BUN 15 6 - 20 mg/dL   Creatinine, Ser 0.91 0.61 - 1.24 mg/dL   Calcium 8.9 8.9 - 10.3 mg/dL   GFR calc non Af Amer >60 >60 mL/min   GFR calc Af Amer >60 >60 mL/min    Comment: (NOTE) The eGFR has been calculated using the CKD EPI equation. This calculation has not been validated in all clinical situations. eGFR's persistently <60 mL/min signify possible Chronic Kidney Disease.    Anion gap 10 5 - 15  Glucose, capillary     Status: Abnormal   Collection Time: 11/26/16  6:39 AM  Result Value Ref Range   Glucose-Capillary 111 (H) 65 - 99 mg/dL  Glucose, capillary     Status: Abnormal   Collection Time: 11/26/16  4:42 PM  Result Value Ref Range   Glucose-Capillary 112 (H) 65 - 99 mg/dL   Glucose, capillary     Status: Abnormal   Collection Time: 11/26/16  8:24 PM  Result Value Ref Range   Glucose-Capillary 120 (H) 65 - 99 mg/dL  Glucose, capillary     Status: None   Collection Time: 11/27/16  6:33 AM  Result Value Ref Range   Glucose-Capillary 99 65 - 99 mg/dL  Glucose, capillary     Status: None   Collection Time: 11/27/16 11:36 AM  Result Value Ref Range   Glucose-Capillary 93 65 - 99 mg/dL  Glucose, capillary     Status: Abnormal   Collection Time: 11/27/16  4:48 PM  Result Value Ref Range   Glucose-Capillary 103 (H) 65 - 99 mg/dL  Glucose, capillary     Status: Abnormal   Collection Time: 11/27/16  8:38 PM  Result Value Ref Range   Glucose-Capillary 131 (H) 65 - 99 mg/dL  Basic metabolic panel     Status: Abnormal   Collection Time: 11/28/16  5:02 AM  Result Value Ref Range   Sodium 134 (L) 135 - 145 mmol/L   Potassium 3.7 3.5 - 5.1 mmol/L    Comment: SLIGHT HEMOLYSIS  Chloride 97 (L) 101 - 111 mmol/L   CO2 29 22 - 32 mmol/L   Glucose, Bld 99 65 - 99 mg/dL   BUN 15 6 - 20 mg/dL   Creatinine, Ser 1.03 0.61 - 1.24 mg/dL   Calcium 9.0 8.9 - 10.3 mg/dL   GFR calc non Af Amer >60 >60 mL/min   GFR calc Af Amer >60 >60 mL/min    Comment: (NOTE) The eGFR has been calculated using the CKD EPI equation. This calculation has not been validated in all clinical situations. eGFR's persistently <60 mL/min signify possible Chronic Kidney Disease.    Anion gap 8 5 - 15  CBC     Status: None   Collection Time: 11/28/16  5:02 AM  Result Value Ref Range   WBC 6.0 4.0 - 10.5 K/uL   RBC 4.41 4.22 - 5.81 MIL/uL   Hemoglobin 13.6 13.0 - 17.0 g/dL   HCT 39.4 39.0 - 52.0 %   MCV 89.3 78.0 - 100.0 fL   MCH 30.8 26.0 - 34.0 pg   MCHC 34.5 30.0 - 36.0 g/dL   RDW 12.0 11.5 - 15.5 %   Platelets 274 150 - 400 K/uL  Glucose, capillary     Status: None   Collection Time: 11/28/16  6:49 AM  Result Value Ref Range   Glucose-Capillary 92 65 - 99 mg/dL     General: NAD.  Vital signs reviewed.  Psych: Mood and affect are appropriate Heart: RRR. No JVD.  Lungs: Clear to auscultation, breathing unlabored Abdomen: Positive bowel sounds, soft  Skin: Intact. Warm and dry. Neurologic:  Motor: B/l UE 5/5 deltoid, bicep, tricep, grip, hip flexor, knee extensors, ankle dorsiflexor and plantar flexor, proximal RUE 4-/5 (pain inhibition) RUE Dysmetria noted.  Sensation diminished to light touch RUE/RLE Musculoskeletal: No edema. No tenderness.   Assessment/Plan: 1. 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 Physiatrist is providing close team supervision and 24 hour management of active medical problems listed below. Physiatrist and rehab team continue to assess barriers to discharge/monitor patient progress toward functional and medical goals. FIM: Function - Bathing Position: Shower Body parts bathed by patient: Right arm, Left arm, Chest, Abdomen, Front perineal area, Right upper leg, Left upper leg, Right lower leg, Left lower leg, Buttocks Body parts bathed by helper: Back Bathing not applicable: Back Assist Level: Supervision or verbal cues  Function- Upper Body Dressing/Undressing What is the patient wearing?: Pull over shirt/dress Pull over shirt/dress - Perfomed by patient: Thread/unthread right sleeve, Thread/unthread left sleeve, Put head through opening, Pull shirt over trunk Assist Level: More than reasonable time Function - Lower Body Dressing/Undressing What is the patient wearing?: Underwear, Pants, Socks, Shoes Position: Wheelchair/chair at [employer] - Performed by patient: Thread/unthread right underwear leg, Thread/unthread left underwear leg, Pull underwear up/down Underwear - Performed by helper: Pull underwear up/down Pants- Performed by patient: Thread/unthread right pants leg,  Thread/unthread left pants leg, Pull pants up/down Pants- Performed by helper: Pull pants up/down Socks - Performed by patient: Don/doff left sock Socks - Performed by helper: Don/doff right sock, Don/doff left sock Shoes - Performed by patient: Don/doff right shoe, Don/doff left shoe Shoes - Performed by helper: Don/doff right shoe, Don/doff left shoe, Fasten right, Fasten left Assist for footwear: Maximal assist Assist for lower body dressing: Touching or steadying assistance (Pt > 75%)  Function - Toileting Toileting activity did not occur:  No continent bowel/bladder event (using urinal, no BM) Toileting steps completed by patient: Adjust clothing prior to toileting, Adjust clothing after toileting Toileting steps completed by helper: Adjust clothing prior to toileting, Performs perineal hygiene, Adjust clothing after toileting Toileting Assistive Devices: Grab bar or rail Assist level: Touching or steadying assistance (Pt.75%)  Function - Air cabin crew transfer activity did not occur:  (using urinal, No BM) Toilet transfer assistive device: Elevated toilet seat/BSC over toilet, Grab bar Assist level to toilet: Supervision or verbal cues Assist level from toilet: Supervision or verbal cues  Function - Chair/bed transfer Chair/bed transfer method: Stand pivot Chair/bed transfer assist level: Touching or steadying assistance (Pt > 75%) Chair/bed transfer assistive device: Armrests Chair/bed transfer details: Verbal cues for technique, Verbal cues for sequencing, Verbal cues for precautions/safety, Tactile cues for placement  Function - Locomotion: Wheelchair Type: Manual Max wheelchair distance: 150 Assist Level: Supervision or verbal cues Assist Level: Supervision or verbal cues Assist Level: Supervision or verbal cues Turns around,maneuvers to table,bed, and toilet,negotiates 3% grade,maneuvers on rugs and over doorsills: No Function - Locomotion: Ambulation Assistive  device: Walker-platform Max distance: 170 Assist level: Supervision or verbal cues Assist level: Supervision or verbal cues Walk 50 feet with 2 turns activity did not occur: Safety/medical concerns Assist level: Supervision or verbal cues Walk 150 feet activity did not occur: Safety/medical concerns Assist level: Supervision or verbal cues Walk 10 feet on uneven surfaces activity did not occur: Safety/medical concerns  Function - Comprehension Comprehension: Auditory Comprehension assist level: Follows complex conversation/direction with extra time/assistive device  Function - Expression Expression: Verbal Expression assist level: Expresses complex ideas: With no assist  Function - Social Interaction Social Interaction assist level: Interacts appropriately with others - No medications needed.  Function - Problem Solving Problem solving assist level: Solves complex problems: With extra time  Function - Memory Memory assist level: More than reasonable amount of time Patient normally able to recall (first 3 days only): Current season, Location of own room, Staff names and faces, That he or she is in a hospital  Medical Problem List and Plan: 1.  Functional and mobility deficits secondary to left thalamic hemorrhage  Continue CIR   MRI demonstrating left thalamic hemorrhage, also involving corona radiata, no additional areas of infarct noted,  Carotid Dopplers, negative, recommending follow-up MRI in ~3 months. 2.  DVT Prophylaxis/Anticoagulation: Pharmaceutical: Lovenox 3. Pain Management:  Will add tramadol prn for knee pain/shoulder pain, has evidence of glenohumeral arthritis but also may have some rotator cuff degeneration discussed potential for steroid injection. He does not wish to proceed with this at the current time. Trial FES, added Kpad 4. Mood: Team to provide ego support. Under lot of stress due to business commitments/end of year. LCSW to follow for evaluation and  support.  5. Neuropsych: This patient is capable of making decisions on his own behalf. 6. Skin/Wound Care: Routine pressure relief measures. Maintain adequate nutritional and hydration status.  7. Fluids/Electrolytes/Nutrition: Monitor I/O.  8. HTN: Monitor BP bid--on HCTZ and cozaar.   Controlled 12/26 9. 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              -consider supportive/OA knee brace for support  11. Prediabetes: Hgb A1c- 6.3. Added CM restrictions.   Consult RD to educate patient on diet.   Controlled 12/26 12. Hyponatremia  Na+ 134 on 12/26 (stable)  Controlled  LOS (  Days) 11 A FACE TO FACE EVALUATION WAS PERFORMED  Ankit Lorie Phenix 11/28/2016, 8:53 AM

## 2016-11-28 NOTE — Progress Notes (Signed)
Physical Therapy Session Note  Patient Details  Name: [name] MRN: [id number] Date of Birth: 02/13/1951  Today's Date: 11/28/2016 PT Concurrent Time: 1515-1610 PT Concurrent Time Calculation (min): 55 min  Short Term Goals:Week 2:  PT Short Term Goal 1 (Week 2): =LTGs due to ELOS  Skilled Therapeutic Interventions/Progress Updates:   Patient in therapy gym, handoff from primary PT. Patient reporting fatigue as well as stress following today's therapies, education and emotional support/encouragement provided throughout session. Session focused on RLE neuro re-ed and forced use with sit <> stand transfers and cues for equal BLE WB, gait training using hemiwalker with and without R AFO with min A overall and verbal/demonstration cues for R heel strike, smaller R step length, and increased L step length to facilitate reciprocal gait pattern and normalize gait pattern with no improvement noted with AFO donned, initial RLE swing phase and heel strike while kicking ball with RLE and LUE supported in place, and gait using hemiwalker kicking yoga block with LLE only to facilitate increased step length, RLE WB, and reciprocal gait pattern, and seated kinetron from wheelchair level at 20 cm/sec x 10 min total with 1 rest break. Patient propelled wheelchair back to room using BLE with increased time and supervision and left sitting in wheelchair with all needs within reach.   Therapy Documentation Precautions:  Precautions Precautions: Fall Precaution Comments: Absent R sided proprioception, severe OA R knee-don knee support brace Required Braces or Orthoses: Sling (for R UE support when OOB) Restrictions Weight Bearing Restrictions: No Pain:  Denies pain  See Function Navigator for Current Functional Status.  Therapy/Group: Concurrent  Sarann Tregre, Prudencio PairRebecca A 11/28/2016, 6:09 AM

## 2016-11-28 NOTE — Progress Notes (Signed)
Physical Therapy Session Note  Patient Details  Name: [name] MRN: [id number] Date of Birth: 01/11/1951  Today's Date: 11/28/2016 PT Individual Time: 1445-1515 PT Individual Time Calculation (min): 30 min    Short Term Goals: Week 2:  PT Short Term Goal 1 (Week 2): =LTGs due to ELOS  Skilled Therapeutic Interventions/Progress Updates:    Pt resting in w/c on arrival with no c/o pain and agreeable to therapy session.  Session focus on gait training with hemiwalker and stair negotiation to simulate home entry.    Pt propelled w/c to therapy gym with BLEs in reciprocal stepping pattern with noted improved efficiency compared to previous sessions.    PT instructed pt in curb step negotiation with HW x2 with steady assist for balance and mod cues for sequencing with HW.  Gait training 2x50' with hemiwalker, supervision on first trial.  Steady assist on second trial as pt focus on R toe off/knee flexion for swing through.  Trial with Toe Off brace, with some improvement.  Will continue to trial orthotics as needed for improved gait pattern.    Handoff to next PT in therapy gym.    Therapy Documentation Precautions:  Precautions Precautions: Fall Precaution Comments: Absent R sided proprioception, severe OA R knee-don knee support brace Required Braces or Orthoses: Sling (for R UE support when OOB) Restrictions Weight Bearing Restrictions: No   See Function Navigator for Current Functional Status.   Therapy/Group: Individual Therapy  Ladora DanielCaitlin E Penven-Crew 11/28/2016, 3:17 PM

## 2016-11-28 NOTE — Progress Notes (Signed)
Occupational Therapy Session Note  Patient Details  Name: Justin FillerStephen Bostrom MRN: [id number] Date of Birth: 11/24/1951  Today's Date: 11/28/2016 OT Individual Time: 1300-1400 OT Individual Time Calculation (min): 60 min    Skilled Therapeutic Interventions/Progress Updates:   1:1 Self care retraining at shower level. Focus on functional ambulation into the bathroom with platform RW with min A to toilet. Pt performed toileting with steadying A. Pt able to ambulate with mod A without AD into the shower. Focus on, in shower, bathing with heavy use of right hand with visual attention to hand. Pt with more accuracy of right hand with visual attention to hand. Pt able to perform sit to stands and standing balance with grab bar with occassional min A. Pt ambulated out of the bathroom without AD with mod A to chair at the sink but pt with difficulty with control of right hand (alien movement with decr sensation feedback) - holding on to items as we passed them. Pt dressed with focus on forced use of right hand with extra time. Introduced hemi walker and trial of functional ambulation with less restrictive device with max VC for sequencing. Left in w/c.   Therapy Documentation Precautions:  Precautions Precautions: Fall Precaution Comments: Absent R sided proprioception, severe OA R knee-don knee support brace Required Braces or Orthoses: Sling (for R UE support when OOB) Restrictions Weight Bearing Restrictions: No Pain:  no c/o pain  ADL: ADL ADL Comments: Please see functional navigator for ADL status  See Function Navigator for Current Functional Status.   Therapy/Group: Individual Therapy  Roney MansSmith, Rosendo Couser [facility] 11/28/2016, 3:52 PM

## 2016-11-29 ENCOUNTER — Inpatient Hospital Stay (HOSPITAL_COMMUNITY): Payer: PPO | Admitting: Physical Therapy

## 2016-11-29 ENCOUNTER — Inpatient Hospital Stay (HOSPITAL_COMMUNITY): Payer: PPO

## 2016-11-29 DIAGNOSIS — E669 Obesity, unspecified: Secondary | ICD-10-CM | POA: Diagnosis not present

## 2016-11-29 DIAGNOSIS — I10 Essential (primary) hypertension: Secondary | ICD-10-CM | POA: Diagnosis not present

## 2016-11-29 DIAGNOSIS — E119 Type 2 diabetes mellitus without complications: Secondary | ICD-10-CM | POA: Diagnosis not present

## 2016-11-29 DIAGNOSIS — R079 Chest pain, unspecified: Secondary | ICD-10-CM | POA: Diagnosis not present

## 2016-11-29 LAB — GLUCOSE, CAPILLARY
Glucose-Capillary: 144 mg/dL — ABNORMAL HIGH (ref 65–99)
Glucose-Capillary: 98 mg/dL (ref 65–99)
Glucose-Capillary: 102 mg/dL — ABNORMAL HIGH (ref 65–99)

## 2016-11-29 NOTE — Progress Notes (Signed)
Occupational Therapy Session Note  Patient Details  Name: Justin FillerStephen Arvanitis MRN: [id number] Date of Birth: 03/11/1951  Today's Date: 11/29/2016 OT Individual Time: 1000-1100 OT Individual Time Calculation (min): 60 min     Short Term Goals: Week 2:  OT Short Term Goal 1 (Week 2): Pt will complete shower transfers with RW vs PFRW with supervision OT Short Term Goal 2 (Week 2): Pt will complete toilet transfer with RW vs PFRW with supervision OT Short Term Goal 3 (Week 2): Pt will complete 2 grooming tasks in standing with supervision  Skilled Therapeutic Interventions/Progress Updates:    Pt engaged in BADL retraining including bathing at shower level and dressing with sit<>stand from w/c.  Focus on increased RUE use for bathing and dressing tasks.  Pt completed bathing tasks with close supervision when standing to bathe buttocks.  Pt able to reach to buttocks with RUE to complete task.  Transfers to walk in shower at supervision level.  Introduced sock aide to assist with donning socks but pt unable to adequately grasp AE to be effective.  Pt stated he had difficulty donning his socks prior to admission secondary to body habitus.  Focus on activity tolerance, sit<>stand, standing balance, functional transfers, RUE use, and safety awareness to increase independence with BADLS.  Therapy Documentation Precautions:  Precautions Precautions: Fall Precaution Comments: Absent R sided proprioception, severe OA R knee-don knee support brace Required Braces or Orthoses: Sling (for R UE support when OOB) Restrictions Weight Bearing Restrictions: No Pain:   Pt denied pain ADL: ADL ADL Comments: Please see functional navigator for ADL status  See Function Navigator for Current Functional Status.   Therapy/Group: Individual Therapy  [name], Channah Godeaux Chappell 11/29/2016, 11:05 AM

## 2016-11-29 NOTE — Progress Notes (Signed)
Physical Therapy Session Note  Patient Details  Name: [name] MRN: [id number] Date of Birth: Oct 31, 1951  Today's Date: 11/29/2016 PT Individual Time: 1330-1430 PT Individual Time Calculation (min): 60 min    Short Term Goals: Week 2:  PT Short Term Goal 1 (Week 2): =LTGs due to ELOS  Skilled Therapeutic Interventions/Progress Updates:  Pt presented in w/c agreeable to therapy. Donned knee brace on RLE. Propelled to rehab gym with min cues for hand placement for facilitation of propulsion, and cues for improved positioning as pt was leaning forward to propel self. Gait training with HW, gait 66f with cues for R foot placement, decreased knee flexion noted which improved with cues however performed inconsistently. Gait trial additional 566fkicking yoga block with improved consistent knee flexion. Kinetron x3 trials 1 min 30cm/sec in seated position 2/2 increased pt fatigue. Performed static balance activities with forced use of LUE and use of HW. Pt propelled self to room with improved technique and remained in w/c with all needs met.   Therapy Documentation Precautions:  Precautions Precautions: Fall Precaution Comments: Absent R sided proprioception, severe OA R knee-don knee support brace Required Braces or Orthoses: Sling (for R UE support when OOB) Restrictions Weight Bearing Restrictions: No General:   Vital Signs: Therapy Vitals Temp: 98.4 F (36.9 C) Temp Source: Oral Pulse Rate: 73 Resp: 17 BP: 122/68 Patient Position (if appropriate): Sitting Oxygen Therapy SpO2: 94 % O2 Device: Not Delivered Pain: Pain Assessment Pain Assessment: No/denies pain Mobility:   Locomotion :    Trunk/Postural Assessment :    Balance:   Exercises:   Other Treatments:     See Function Navigator for Current Functional Status.   Therapy/Group: Individual Therapy  Eldred Sooy  Rain [place], PTA  11/29/2016, 3:37 PM

## 2016-11-29 NOTE — Patient Care Conference (Signed)
Inpatient RehabilitationTeam Conference and Plan of Care Update Date: 11/29/2016   Time: 11:30 AM    Patient Name: [name]      Medical Record Number: [id number]  Date of Birth: [birth year] Sex: Male         Room/Bed: 4W09C/4W09C-01 Payor Info: Payor: [employer] / Plan: [employer] / Product Type: *No Product type* /    Admitting Diagnosis: cva  Admit Date/Time:  11/17/2016 11:39 AM Admission Comments: No comment available   Primary Diagnosis:  <principal problem not specified> Principal Problem: <principal problem not specified>  Patient Active Problem List   Diagnosis Date Noted  . Alteration of sensations, post-stroke   . Hyponatremia   . Chronic right shoulder pain   . HTN (hypertension) 11/23/2016  . Prediabetes 11/23/2016  . Obesity 11/23/2016  . Right shoulder injury 11/23/2016  . Arthropathy of both knees 11/23/2016  . ICH (intracerebral hemorrhage) ([place]) 11/17/2016    Expected Discharge Date: Expected Discharge Date: 12/07/16  Team Members Present: Physician leading conference: Dr. Delice Lesch Social Worker Present: Ovidio Kin, LCSW Nurse Present: Dorien Chihuahua, RN PT Present: Dwyane Dee, PT OT Present: Willeen Cass, OT SLP Present: Windell Moulding, SLP PPS Coordinator present : Daiva Nakayama, RN, CRRN     Current Status/Progress Goal Weekly Team Focus  Medical   Functional and mobility deficits secondary to left thalamic hemorrhage with right shoulder pain  Improve mobility, safety, follow BP  See above   Bowel/Bladder        Cont B & B     Swallow/Nutrition/ Hydration             ADL's   bathing/dressing-min A; functoinal tranfsers-steady A/supervision  Mod I overall, supervision shower transfers  BADL retraining, RUE NMR, transfers, R attention, safety awareness   Mobility   supervision/min assist overall, ambulating with hemiwalker  supervision>mod I  NMR, balance, coordination, motor planning, ambulation, transfers,  safety awareness, R attention   Communication             Safety/Cognition/ Behavioral Observations            Pain     managing pain   less than 3-MD managing shoulder pain     Skin       na         *See Care Plan and progress notes for long and short-term goals.  Barriers to Discharge: Mobility, safety, left sided awareness    Possible Resolutions to Barriers:  therapies, CBGs stabalizing    Discharge Planning/Teaching Needs:  Wife to come in for family education, pt is doing well and pleased with his progress here.      Team Discussion:  Progressing toward his goals of supervision-mod/i level. Pain in shoulder limits him, using voltaren gel. MD stopped checking BS within normal limits. Poor sensation in right side limits his progress. DC-SP met his goals. Have wife come in to see him in therapies this week-early next week  Revisions to Treatment Plan:  DC from Speech therapy met his goals-DC 1/4   Continued Need for Acute Rehabilitation Level of Care: The patient requires daily medical management by a physician with specialized training in physical medicine and rehabilitation for the following conditions: Daily direction of a multidisciplinary physical rehabilitation program to ensure safe treatment while eliciting the highest outcome that is of practical value to the patient.: Yes Daily medical management of patient stability for increased activity during participation in an intensive rehabilitation regime.: Yes Daily analysis of laboratory values and/or  radiology reports with any subsequent need for medication adjustment of medical intervention for : Neurological problems  Kolston Lacount, Gardiner Rhyme 11/29/2016, 3:02 PM

## 2016-11-29 NOTE — Progress Notes (Signed)
Subjective/Complaints: Pt seen sitting up in his chair this AM.  He is talking on the phone throughout exam.  He gives me thumbs up to indicate he slept well and does not have any issues at present.   Review of systems: Denies CP, SOB, N/V/D.  Objective: Vital Signs: Blood pressure (!) 123/58, pulse 63, temperature 98.4 F (36.9 C), temperature source Oral, resp. rate 18, weight 113.5 kg (250 lb 3.6 oz), SpO2 95 %. No results found. Results for orders placed or performed during the hospital encounter of 11/17/16 (from the past 72 hour(s))  Glucose, capillary     Status: Abnormal   Collection Time: 11/26/16  4:42 PM  Result Value Ref Range   Glucose-Capillary 112 (H) 65 - 99 mg/dL  Glucose, capillary     Status: Abnormal   Collection Time: 11/26/16  8:24 PM  Result Value Ref Range   Glucose-Capillary 120 (H) 65 - 99 mg/dL  Glucose, capillary     Status: None   Collection Time: 11/27/16  6:33 AM  Result Value Ref Range   Glucose-Capillary 99 65 - 99 mg/dL  Glucose, capillary     Status: None   Collection Time: 11/27/16 11:36 AM  Result Value Ref Range   Glucose-Capillary 93 65 - 99 mg/dL  Glucose, capillary     Status: Abnormal   Collection Time: 11/27/16  4:48 PM  Result Value Ref Range   Glucose-Capillary 103 (H) 65 - 99 mg/dL  Glucose, capillary     Status: Abnormal   Collection Time: 11/27/16  8:38 PM  Result Value Ref Range   Glucose-Capillary 131 (H) 65 - 99 mg/dL  Basic metabolic panel     Status: Abnormal   Collection Time: 11/28/16  5:02 AM  Result Value Ref Range   Sodium 134 (L) 135 - 145 mmol/L   Potassium 3.7 3.5 - 5.1 mmol/L    Comment: SLIGHT HEMOLYSIS   Chloride 97 (L) 101 - 111 mmol/L   CO2 29 22 - 32 mmol/L   Glucose, Bld 99 65 - 99 mg/dL   BUN 15 6 - 20 mg/dL   Creatinine, Ser 1.03 0.61 - 1.24 mg/dL   Calcium 9.0 8.9 - 10.3 mg/dL   GFR calc non Af Amer >60 >60 mL/min   GFR calc Af Amer >60 >60 mL/min    Comment: (NOTE) The eGFR has been  calculated using the CKD EPI equation. This calculation has not been validated in all clinical situations. eGFR's persistently <60 mL/min signify possible Chronic Kidney Disease.    Anion gap 8 5 - 15  CBC     Status: None   Collection Time: 11/28/16  5:02 AM  Result Value Ref Range   WBC 6.0 4.0 - 10.5 K/uL   RBC 4.41 4.22 - 5.81 MIL/uL   Hemoglobin 13.6 13.0 - 17.0 g/dL   HCT 39.4 39.0 - 52.0 %   MCV 89.3 78.0 - 100.0 fL   MCH 30.8 26.0 - 34.0 pg   MCHC 34.5 30.0 - 36.0 g/dL   RDW 12.0 11.5 - 15.5 %   Platelets 274 150 - 400 K/uL  Glucose, capillary     Status: None   Collection Time: 11/28/16  6:49 AM  Result Value Ref Range   Glucose-Capillary 92 65 - 99 mg/dL  Glucose, capillary     Status: Abnormal   Collection Time: 11/28/16 11:35 AM  Result Value Ref Range   Glucose-Capillary 115 (H) 65 - 99 mg/dL  Glucose, capillary  Status: None   Collection Time: 11/28/16  4:27 PM  Result Value Ref Range   Glucose-Capillary 78 65 - 99 mg/dL  Glucose, capillary     Status: Abnormal   Collection Time: 11/28/16  8:58 PM  Result Value Ref Range   Glucose-Capillary 100 (H) 65 - 99 mg/dL  Glucose, capillary     Status: None   Collection Time: 11/29/16  6:36 AM  Result Value Ref Range   Glucose-Capillary 98 65 - 99 mg/dL     General: NAD. Vital signs reviewed.  Psych: Mood and affect are appropriate Heart: RRR. No JVD.  Lungs: Clear to auscultation, breathing unlabored Abdomen: Positive bowel sounds, soft  Skin: Intact. Warm and dry. Neurologic:  Motor: B/l UE 5/5 deltoid, bicep, tricep, grip, hip flexor, knee extensors, ankle dorsiflexor and plantar flexor, proximal RUE 4+/5 (pain inhibition) RUE Dysmetria noted.  Sensation diminished to light touch RUE/RLE Musculoskeletal: No edema. No tenderness.   Assessment/Plan: 1. 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 Physiatrist is providing close team supervision and 24 hour management of active medical problems listed below. Physiatrist and rehab team continue to assess barriers to discharge/monitor patient progress toward functional and medical goals. FIM: Function - Bathing Position: Shower Body parts bathed by patient: Right arm, Left arm, Chest, Abdomen, Front perineal area, Right upper leg, Left upper leg, Right lower leg, Left lower leg, Buttocks Body parts bathed by helper: Back Bathing not applicable: Back Assist Level: Supervision or verbal cues, Touching or steadying assistance(Pt > 75%)  Function- Upper Body Dressing/Undressing What is the patient wearing?: Pull over shirt/dress Pull over shirt/dress - Perfomed by patient: Thread/unthread right sleeve, Thread/unthread left sleeve, Put head through opening, Pull shirt over trunk Assist Level: Set up Function - Lower Body Dressing/Undressing What is the patient wearing?: Underwear, Pants, Socks, Shoes Position: Wheelchair/chair at [employer] - Performed by patient: Thread/unthread right underwear leg, Thread/unthread left underwear leg, Pull underwear up/down Underwear - Performed by helper: Pull underwear up/down Pants- Performed by patient: Thread/unthread right pants leg, Thread/unthread left pants leg, Pull pants up/down Pants- Performed by helper: Pull pants up/down Socks - Performed by patient: Don/doff left sock Socks - Performed by helper: Don/doff right sock, Don/doff left sock Shoes - Performed by patient: Don/doff right shoe, Don/doff left shoe Shoes - Performed by helper: Don/doff right shoe, Don/doff left shoe, Fasten right, Fasten left Assist for footwear: Maximal assist Assist for lower body dressing: Touching or steadying assistance (Pt > 75%)  Function - Toileting Toileting activity did not occur: No continent bowel/bladder event (using urinal, no BM) Toileting steps completed  by patient: Adjust clothing prior to toileting, Adjust clothing after toileting, Performs perineal hygiene Toileting steps completed by helper: Adjust clothing prior to toileting, Performs perineal hygiene, Adjust clothing after toileting Toileting Assistive Devices: Grab bar or rail Assist level: Touching or steadying assistance (Pt.75%)  Function - Air cabin crew transfer activity did not occur:  (using urinal, No BM) Toilet transfer assistive device: Elevated toilet seat/BSC over toilet, Grab bar Assist level to toilet: Touching or steadying assistance (Pt > 75%) Assist level from toilet: Touching or steadying assistance (Pt > 75%)  Function - Chair/bed transfer Chair/bed transfer method: Ambulatory Chair/bed transfer assist level: Supervision or verbal cues Chair/bed transfer assistive device: Armrests, Walker Chair/bed transfer details: Verbal cues for technique, Verbal cues for sequencing, Verbal cues for precautions/safety, Tactile cues for  placement  Function - Locomotion: Wheelchair Type: Manual Max wheelchair distance: 150 Assist Level: Supervision or verbal cues Assist Level: Supervision or verbal cues Assist Level: Supervision or verbal cues Turns around,maneuvers to table,bed, and toilet,negotiates 3% grade,maneuvers on rugs and over doorsills: No Function - Locomotion: Ambulation Assistive device: Walker-hemi, Orthosis Max distance: 50 ft Assist level: Touching or steadying assistance (Pt > 75%) Assist level: Touching or steadying assistance (Pt > 75%) Walk 50 feet with 2 turns activity did not occur: Safety/medical concerns Assist level: Touching or steadying assistance (Pt > 75%) Walk 150 feet activity did not occur: Safety/medical concerns Assist level: Supervision or verbal cues Walk 10 feet on uneven surfaces activity did not occur: Safety/medical concerns  Function - Comprehension Comprehension: Auditory Comprehension assist level: Follows complex  conversation/direction with extra time/assistive device  Function - Expression Expression: Verbal Expression assist level: Expresses complex ideas: With no assist  Function - Social Interaction Social Interaction assist level: Interacts appropriately with others - No medications needed.  Function - Problem Solving Problem solving assist level: Solves complex problems: With extra time  Function - Memory Memory assist level: More than reasonable amount of time Patient normally able to recall (first 3 days only): Current season, Location of own room, Staff names and faces, That he or she is in a hospital  Medical Problem List and Plan: 1.  Functional and mobility deficits secondary to left thalamic hemorrhage  Continue CIR   MRI demonstrating left thalamic hemorrhage, also involving corona radiata, no additional areas of infarct noted,  Carotid Dopplers, negative, recommending follow-up MRI in ~3 months. 2.  DVT Prophylaxis/Anticoagulation: Pharmaceutical: Lovenox 3. Pain Management:  Will add tramadol prn for knee pain/shoulder pain, has evidence of glenohumeral arthritis but also may have some rotator cuff degeneration discussed potential for steroid injection. He does not wish to proceed with this at the current time. Trial FES, added Kpad 4. Mood: Team to provide ego support. Under lot of stress due to business commitments/end of year. LCSW to follow for evaluation and support.  5. Neuropsych: This patient is capable of making decisions on his own behalf. 6. Skin/Wound Care: Routine pressure relief measures. Maintain adequate nutritional and hydration status.  7. Fluids/Electrolytes/Nutrition: Monitor I/O.  8. HTN: Monitor BP bid--on HCTZ and cozaar.   Controlled 12/27 9. 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              -consider supportive/OA knee brace for support  11.  Prediabetes: Hgb A1c- 6.3. Added CM restrictions.   Consult RD to educate patient on diet.   Controlled 12/27, will d/c CBGs 12. Hyponatremia  Na+ 134 on 12/26 (stable)  Controlled 13. Morbid obesity  Body mass index is 39.19 kg/m.  Diet and exercise education  Will cont to encourage weight loss to increase endurance and promote overall health  LOS (Days) 12 A FACE TO FACE EVALUATION WAS PERFORMED  Nicola Heinemann Lorie Phenix 11/29/2016, 9:07 AM

## 2016-11-29 NOTE — Progress Notes (Signed)
Physical Therapy Session Note  Patient Details  Name: [name] MRN: [id number] Date of Birth: 07-05-1951  Today's Date: 11/29/2016 PT Individual Time: 0904-0959 PT Individual Time Calculation (min): 55 min    Short Term Goals: Week 2:  PT Short Term Goal 1 (Week 2): =LTGs due to ELOS  Skilled Therapeutic Interventions/Progress Updates:    Pt at nursing station on arrival, agreeable to therapy session, no c/o pain.  Pt propelled w/c throughout unit mod I up to 150'.  Session focus on RLE NMR, balance, and gait with hemiwalker.    Gait training with hemiwalker x50' kicking yoga block with RLE for improved knee/hip flexion during swing through and to facilitate R heel strike.  Pt continues to have decreased heel strike with optimum knee/hip flexion, and decreased knee/hip flexion with optimum heel strike.   NMR at stairs focus on knee/hip flexion and ankle dorsiflexion for carryover into gait pattern with repeated step ups to 6" step leading with RLE until fatigue.  R knee buckling towards end of exercise, pt reports increased discomfort with knee extension in closed and open chain activities.  NMR in quadruped for forced weight bearing through RUE/RLE with LUE lifts.  Pt becoming fearful of RUE buckling and transitioned to supine with supervision.    Manual distraction to RLE for pain control in R knee with positive effect per pt report.  Pt transferred supine>sit>w/c at end of session with supervision, returned to room and left with call bell in reach and needs met.    Therapy Documentation Precautions:  Precautions Precautions: Fall Precaution Comments: Absent R sided proprioception, severe OA R knee-don knee support brace Required Braces or Orthoses: Sling (for R UE support when OOB) Restrictions Weight Bearing Restrictions: No   See Function Navigator for Current Functional Status.   Therapy/Group: Individual Therapy  Cleo Villamizar E Penven-Crew 11/29/2016, 9:24 AM

## 2016-11-29 NOTE — Progress Notes (Signed)
Physical Therapy Session Note  Patient Details  Name: [name] MRN: [id number] Date of Birth: 01-Jun-1951  Today's Date: 11/29/2016 PT Individual Time: 1950-9326 PT Individual Time Calculation (min): 25 min    Short Term Goals: Week 2:  PT Short Term Goal 1 (Week 2): =LTGs due to ELOS  Skilled Therapeutic Interventions/Progress Updates:    Pt exiting room in w/c on arrival, no c/o pain, reports changing rooms and requesting to accompany his personal items and put them away.  Session focus on increasing independence with functional tasks and dynamic sitting balance from w/c level.  Pt propelled w/c from 4W09 to 4M08 mod I using BLEs and LUE.  Once in room, pt able to put away all personal items in drawers and on shelves demonstrating dynamic sitting balance reaching outside BOS with supervision assist.  PT and pt discussed upcoming discharge including progressing to LRAD throughout remainder of stay, and use of R knee brace for OA pain.  Pt left in w/c at end of session with call bell in reach and needs met.    Therapy Documentation Precautions:  Precautions Precautions: Fall Precaution Comments: Absent R sided proprioception, severe OA R knee-don knee support brace Required Braces or Orthoses: Sling (for R UE support when OOB) Restrictions Weight Bearing Restrictions: No   See Function Navigator for Current Functional Status.   Therapy/Group: Individual Therapy  Earnest Conroy Penven-Crew 11/29/2016, 4:30 PM

## 2016-11-29 NOTE — Progress Notes (Signed)
Social Work Patient ID: [name], male   DOB: 05/29/1951, 65 y.o.   MRN: [id number]  Met with pt to discuss team conference progressing toward his goals and target still 1/4. He can see his progress he just wished He could feel his right side more, his decreased sensation limits him and makes him really concentrate in therapies. He wants to make sure he is at a level where his wife will not need to physically assist him, he worries  About her. Discussed OP therapies will pursue in [place], closer to pt's home.

## 2016-11-30 ENCOUNTER — Inpatient Hospital Stay (HOSPITAL_COMMUNITY): Payer: PPO | Admitting: Physical Therapy

## 2016-11-30 ENCOUNTER — Inpatient Hospital Stay (HOSPITAL_COMMUNITY): Payer: PPO

## 2016-11-30 NOTE — Progress Notes (Signed)
Physical Therapy Note  Patient Details  Name: [name] MRN: [id number] Date of Birth: 09/30/1951 Today's Date: 11/30/2016  1100-1205, 65 min indivieual tx Pain: none reported  W/c propulsion using bil LEs, LUE to/from therapy.  neuromuscular re-education via multimodal cues for trunk shortening/lengthening/rotating in unsupported sitting, reaching out of BOS to R/L.  In supported sitting, therapeutic activity using r hand to grasp and release small figures, requiring trunk flexion and rotation.  Discussed fitness for life; pt stated he and his wife have each gained weight and lost muscle mass in last 10 years.  Standing on wedge for prolonged stretch bil heel cords nd balance challenge.  Instructed pt in self stretching R hamstring and heel cord in sitting with/without strap and foot stool; x 30 seconds x 2 with dual task counting backwards 30-0 and 60-30 without difficulty; hand out issued.  Gait trial with bari RW, R hand splint and R knee orthosis; pt's R hand slips laterally within hand splint due to reduced proprioception.  Pt left resting in w/c with all needs within reach.     See function navigator for current status.  Caitland Porchia 11/30/2016, 10:59 AM

## 2016-11-30 NOTE — Progress Notes (Signed)
Physical Therapy Session Note  Patient Details  Name: [name] MRN: [id number] Date of Birth: 1951-03-31  Today's Date: 11/30/2016 PT Individual Time: 1300-1409 PT Individual Time Calculation (min): 69 min    Short Term Goals: Week 2:  PT Short Term Goal 1 (Week 2): =LTGs due to ELOS  Skilled Therapeutic Interventions/Progress Updates:    Pt resting in w/c on arrival with no c/o pain and agreeable to therapy session.  Session focus on gait training, NMR for RUE/RLE, and standing balance.    Pt propels w/c throughout unit with BLEs for reciprocal stepping pattern NMR, max distance 150' without rest break, mod I.  Gait training x70' with hemiwalker for orthotics assessment, overall supervision with cues for equalizing step length, R knee flexion during swing, and R heel strike.  Gait training x100' with PLS AFO on RLE for improved heelstrike with overall supervision.  Noted improvement in gait pattern with AFO donned, improved knee flexion during swing as well as heel strike in appx 75% of opportunities.    NMR on kinetron with forced dorsiflexion in sitting x4 minutes at 30 cm/s followed by 15 reps BLEs at 10 cm/s in standing focus on return to equal weight bearing after each stroke.  Standing balance at dynavision 3 trials of mode A focus on reaching outside BOS and use of RUE during task.  Score imprved from 23>28>31 and reaction time improved from 2.61s>2.14s>1.94s.    Pt returned to room at end of session and left upright in w/c with call bell in reach and needs met.    Therapy Documentation Precautions:  Precautions Precautions: Fall Precaution Comments: Absent R sided proprioception, severe OA R knee-don knee support brace Required Braces or Orthoses: Sling (for R UE support when OOB) Restrictions Weight Bearing Restrictions: No   See Function Navigator for Current Functional Status.   Therapy/Group: Individual Therapy  [name] 11/30/2016, 2:11 PM

## 2016-11-30 NOTE — Progress Notes (Signed)
Occupational Therapy Session Note  Patient Details  Name: [name] MRN: [id number] Date of Birth: 11/04/1951  Today's Date: 11/30/2016 OT Individual Time: 1000-1100 OT Individual Time Calculation (min): 60 min     Short Term Goals: Week 2:  OT Short Term Goal 1 (Week 2): Pt will complete shower transfers with RW vs PFRW with supervision OT Short Term Goal 2 (Week 2): Pt will complete toilet transfer with RW vs PFRW with supervision OT Short Term Goal 3 (Week 2): Pt will complete 2 grooming tasks in standing with supervision  Skilled Therapeutic Interventions/Progress Updates:    Pt engaged in BADL retraining including bathing at shower level and dressing with sit<>stand from w/c at sink.  Pt completed bathing tasks with close supervision when standing to bathe buttocks.  Pt required assistance with donning socks and pulling up underwear on R side using RUE.  Pt uses RUE appropriately at diminished level for all tasks.  Pt requires more than a reasonable amount of time with multiple rest breaks.  Pt noted with continued inattention to RUE when not engaged functionally.  Pt remained in w/c with all needs within reach.  Therapy Documentation Precautions:  Precautions Precautions: Fall Precaution Comments: Absent R sided proprioception, severe OA R knee-don knee support brace Required Braces or Orthoses: Sling (for R UE support when OOB) Restrictions Weight Bearing Restrictions: No   Pain:  Pt denied pain  See Function Navigator for Current Functional Status.   Therapy/Group: Individual Therapy  [name], Aldrick Derrig Chappell 11/30/2016, 12:19 PM

## 2016-11-30 NOTE — Progress Notes (Signed)
Subjective/Complaints: Pt seen sitting up in his chair this AM after returning from the restroom.  He slept well overnight.  He is Patent attorney.   Review of systems: Denies CP, SOB, N/V/D.  Objective: Vital Signs: Blood pressure (!) 141/74, pulse (!) 59, temperature 98.2 F (36.8 C), temperature source Oral, resp. rate 16, weight 113.5 kg (250 lb 3.6 oz), SpO2 92 %. No results found. Results for orders placed or performed during the hospital encounter of 11/17/16 (from the past 72 hour(s))  Glucose, capillary     Status: None   Collection Time: 11/27/16 11:36 AM  Result Value Ref Range   Glucose-Capillary 93 65 - 99 mg/dL  Glucose, capillary     Status: Abnormal   Collection Time: 11/27/16  4:48 PM  Result Value Ref Range   Glucose-Capillary 103 (H) 65 - 99 mg/dL  Glucose, capillary     Status: Abnormal   Collection Time: 11/27/16  8:38 PM  Result Value Ref Range   Glucose-Capillary 131 (H) 65 - 99 mg/dL  Basic metabolic panel     Status: Abnormal   Collection Time: 11/28/16  5:02 AM  Result Value Ref Range   Sodium 134 (L) 135 - 145 mmol/L   Potassium 3.7 3.5 - 5.1 mmol/L    Comment: SLIGHT HEMOLYSIS   Chloride 97 (L) 101 - 111 mmol/L   CO2 29 22 - 32 mmol/L   Glucose, Bld 99 65 - 99 mg/dL   BUN 15 6 - 20 mg/dL   Creatinine, Ser 1.03 0.61 - 1.24 mg/dL   Calcium 9.0 8.9 - 10.3 mg/dL   GFR calc non Af Amer >60 >60 mL/min   GFR calc Af Amer >60 >60 mL/min    Comment: (NOTE) The eGFR has been calculated using the CKD EPI equation. This calculation has not been validated in all clinical situations. eGFR's persistently <60 mL/min signify possible Chronic Kidney Disease.    Anion gap 8 5 - 15  CBC     Status: None   Collection Time: 11/28/16  5:02 AM  Result Value Ref Range   WBC 6.0 4.0 - 10.5 K/uL   RBC 4.41 4.22 - 5.81 MIL/uL   Hemoglobin 13.6 13.0 - 17.0 g/dL   HCT 39.4 39.0 - 52.0 %   MCV 89.3 78.0 - 100.0 fL   MCH 30.8 26.0 - 34.0 pg   MCHC 34.5 30.0 - 36.0  g/dL   RDW 12.0 11.5 - 15.5 %   Platelets 274 150 - 400 K/uL  Glucose, capillary     Status: None   Collection Time: 11/28/16  6:49 AM  Result Value Ref Range   Glucose-Capillary 92 65 - 99 mg/dL  Glucose, capillary     Status: Abnormal   Collection Time: 11/28/16 11:35 AM  Result Value Ref Range   Glucose-Capillary 115 (H) 65 - 99 mg/dL  Glucose, capillary     Status: None   Collection Time: 11/28/16  4:27 PM  Result Value Ref Range   Glucose-Capillary 78 65 - 99 mg/dL  Glucose, capillary     Status: Abnormal   Collection Time: 11/28/16  8:58 PM  Result Value Ref Range   Glucose-Capillary 100 (H) 65 - 99 mg/dL  Glucose, capillary     Status: None   Collection Time: 11/29/16  6:36 AM  Result Value Ref Range   Glucose-Capillary 98 65 - 99 mg/dL  Glucose, capillary     Status: Abnormal   Collection Time: 11/29/16 11:39 AM  Result Value  Ref Range   Glucose-Capillary 102 (H) 65 - 99 mg/dL     General: NAD. Vital signs reviewed.  Psych: Mood and affect are appropriate Heart: RRR. No JVD.  Lungs: Clear to auscultation, breathing unlabored Abdomen: Positive bowel sounds, soft  Skin: Intact. Warm and dry. Neurologic:  Motor: B/l UE 5/5 deltoid, bicep, tricep, grip, hip flexor, knee extensors, ankle dorsiflexor and plantar flexor, proximal RUE 4+-5/5 (pain inhibition) RUE Dysmetria noted.  Sensation diminished to light touch RUE/RLE Musculoskeletal: No edema. No tenderness.   Assessment/Plan: 1. 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 Physiatrist is providing close team supervision and 24 hour management of active medical problems listed below. Physiatrist and rehab team continue to assess barriers to discharge/monitor patient progress toward functional and medical goals. FIM: Function - Bathing Position: Shower Body parts bathed  by patient: Right arm, Left arm, Chest, Abdomen, Front perineal area, Right upper leg, Left upper leg, Right lower leg, Left lower leg, Buttocks Body parts bathed by helper: Back Bathing not applicable: Back Assist Level: Supervision or verbal cues, Touching or steadying assistance(Pt > 75%)  Function- Upper Body Dressing/Undressing What is the patient wearing?: Pull over shirt/dress Pull over shirt/dress - Perfomed by patient: Thread/unthread right sleeve, Thread/unthread left sleeve, Put head through opening, Pull shirt over trunk Assist Level: Set up Function - Lower Body Dressing/Undressing What is the patient wearing?: Underwear, Pants, Socks, Shoes Position: Wheelchair/chair at [employer] - Performed by patient: Thread/unthread right underwear leg, Thread/unthread left underwear leg, Pull underwear up/down Underwear - Performed by helper: Pull underwear up/down Pants- Performed by patient: Thread/unthread right pants leg, Thread/unthread left pants leg, Pull pants up/down Pants- Performed by helper: Pull pants up/down Socks - Performed by patient: Don/doff left sock Socks - Performed by helper: Don/doff right sock, Don/doff left sock Shoes - Performed by patient: Don/doff right shoe, Don/doff left shoe, Fasten right, Fasten left Shoes - Performed by helper: Don/doff right shoe, Don/doff left shoe, Fasten right, Fasten left Assist for footwear: Maximal assist Assist for lower body dressing: Touching or steadying assistance (Pt > 75%)  Function - Toileting Toileting activity did not occur: No continent bowel/bladder event (using urinal, no BM) Toileting steps completed by patient: Adjust clothing prior to toileting, Adjust clothing after toileting, Performs perineal hygiene Toileting steps completed by helper: Adjust clothing prior to toileting, Performs perineal hygiene, Adjust clothing after toileting Toileting Assistive Devices: Grab bar or rail Assist level: Touching or  steadying assistance (Pt.75%)  Function - Air cabin crew transfer activity did not occur:  (using urinal, No BM) Toilet transfer assistive device: Elevated toilet seat/BSC over toilet, Grab bar Assist level to toilet: Touching or steadying assistance (Pt > 75%) Assist level from toilet: Touching or steadying assistance (Pt > 75%)  Function - Chair/bed transfer Chair/bed transfer method: Stand pivot, Squat pivot Chair/bed transfer assist level: Supervision or verbal cues Chair/bed transfer assistive device: Armrests, Walker Chair/bed transfer details: Verbal cues for technique, Verbal cues for sequencing, Verbal cues for precautions/safety, Tactile cues for placement  Function - Locomotion: Wheelchair Type: Manual Max wheelchair distance: 300 Assist Level: No help, No cues, assistive device, takes more than reasonable amount of time Assist Level: No help, No cues, assistive device, takes more than reasonable amount of time Assist Level: No help, No cues, assistive device, takes more than reasonable amount of time Turns around,maneuvers to table,bed, and toilet,negotiates 3% grade,maneuvers on  rugs and over doorsills: No Function - Locomotion: Ambulation Assistive device: Walker-hemi Max distance: 75 Assist level: Touching or steadying assistance (Pt > 75%) Assist level: Touching or steadying assistance (Pt > 75%) Walk 50 feet with 2 turns activity did not occur: Safety/medical concerns Assist level: Touching or steadying assistance (Pt > 75%) Walk 150 feet activity did not occur: Safety/medical concerns Assist level: Supervision or verbal cues Walk 10 feet on uneven surfaces activity did not occur: Safety/medical concerns  Function - Comprehension Comprehension: Auditory Comprehension assist level: Follows complex conversation/direction with extra time/assistive device  Function - Expression Expression: Verbal Expression assist level: Expresses complex ideas: With no  assist  Function - Social Interaction Social Interaction assist level: Interacts appropriately with others - No medications needed.  Function - Problem Solving Problem solving assist level: Solves complex problems: With extra time  Function - Memory Memory assist level: More than reasonable amount of time Patient normally able to recall (first 3 days only): Current season, Location of own room, Staff names and faces, That he or she is in a hospital  Medical Problem List and Plan: 1.  Functional and mobility deficits secondary to left thalamic hemorrhage  Continue CIR   MRI demonstrating left thalamic hemorrhage, also involving corona radiata, no additional areas of infarct noted,  Carotid Dopplers, negative, recommending follow-up MRI in ~3 months. 2.  DVT Prophylaxis/Anticoagulation: Pharmaceutical: Lovenox 3. Pain Management:  Will add tramadol prn for knee pain/shoulder pain, has evidence of glenohumeral arthritis but also may have some rotator cuff degeneration discussed potential for steroid injection. He does not wish to proceed with this at the current time. Trial FES, added Kpad 4. Mood: Team to provide ego support. Under lot of stress due to business commitments/end of year. LCSW to follow for evaluation and support.  5. Neuropsych: This patient is capable of making decisions on his own behalf. 6. Skin/Wound Care: Routine pressure relief measures. Maintain adequate nutritional and hydration status.  7. Fluids/Electrolytes/Nutrition: Monitor I/O.  8. HTN: Monitor BP bid--on HCTZ and cozaar.   Controlled 12/28 9. 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              -consider supportive/OA knee brace for support  11. Prediabetes: Hgb A1c- 6.3. Added CM restrictions.   Consult RD to educate patient on diet.   Controlled, d/ced CBGs 12. Hyponatremia  Na+ 134 on 12/26  (stable)  Controlled 13. Morbid obesity  Body mass index is 39.19 kg/m.  Diet and exercise education  Will cont to encourage weight loss to increase endurance and promote overall health  LOS (Days) 13 A FACE TO FACE EVALUATION WAS PERFORMED  Ankit Lorie Phenix 11/30/2016, 9:49 AM

## 2016-11-30 NOTE — Progress Notes (Signed)
Physical Therapy Session Note  Patient Details  Name: [name] MRN: [id number] Date of Birth: 1951/10/03  Today's Date: 11/30/2016 PT Individual Time: 1600-1630 PT Individual Time Calculation (min): 30 min    Short Term Goals: Week 2:  PT Short Term Goal 1 (Week 2): =LTGs due to ELOS  Skilled Therapeutic Interventions/Progress Updates:    Pt resting in w/c on arrival with no c/o pain and agreeable to therapy session.  Session focus on car transfers, sit<>stand with hemiwalker, gait training, standing balance, and picking up objects from floor.    Pt propelled w/c throughout unit mod I, max distance 150' with improved fluidity of propulsion compared to previous sessions.  PT instructed pt in car transfer with steady assist and min verbal cues for safe set up and sequencing.    Gait training up/down incline with hemiwalker and close supervision for safety, pt with no LOB and tolerated well.  Gait training x150' with hemiwalker and supervision with noted improved equal step length, R knee flexion, and R heel strike with PLS AFO.  Pt with one episode of R knee buckling during approach to therapy mat, but able to self correct with no assist provided from PT.    Standing balance task focus on RUE use to throw horseshoes.  Pt able to stoop to pick up 3 horseshoes from floor with close supervision and verbal cues for safety.  Pt returned to room at end of session and positioned in w/c with call bell in reach and needs met.   Therapy Documentation Precautions:  Precautions Precautions: Fall Precaution Comments: Absent R sided proprioception, severe OA R knee-don knee support brace Required Braces or Orthoses: Sling (for R UE support when OOB) Restrictions Weight Bearing Restrictions: No   See Function Navigator for Current Functional Status.   Therapy/Group: Individual Therapy  Earnest Conroy Penven-Crew 11/30/2016, 4:43 PM

## 2016-12-01 ENCOUNTER — Inpatient Hospital Stay (HOSPITAL_COMMUNITY): Payer: PPO | Admitting: Occupational Therapy

## 2016-12-01 ENCOUNTER — Inpatient Hospital Stay (HOSPITAL_COMMUNITY): Payer: PPO | Admitting: Physical Therapy

## 2016-12-01 ENCOUNTER — Inpatient Hospital Stay (HOSPITAL_COMMUNITY): Payer: PPO

## 2016-12-01 DIAGNOSIS — R0989 Other specified symptoms and signs involving the circulatory and respiratory systems: Secondary | ICD-10-CM

## 2016-12-01 NOTE — Progress Notes (Signed)
Occupational Therapy Weekly Progress Note  Patient Details  Name: [name] MRN: [id number] Date of Birth: 1951/08/02  Beginning of progress report period: November 24, 2016 End of progress report period: December 01, 2016  Patient has met 3 of 3 short term goals.  Pt made steady progress with BADLs during the past week.  Pt performs functional transfers with min A and completes bathing task at shower level with supervision.  Pt continues to require assistance with donning socks but is able.  Pt uses BUE for all functional tasks and intentionally incorporates his RUE in all tasks.    Patient continues to demonstrate the following deficits: muscle weakness, decreased cardiorespiratoy endurance, impaired timing and sequencing, abnormal tone, unbalanced muscle activation, decreased coordination and decreased motor planning and decreased standing balance, decreased postural control, hemiplegia and decreased balance strategies and therefore will continue to benefit from skilled OT intervention to enhance overall performance with BADL, iADL and Reduce care partner burden.  Patient progressing toward long term goals..  Continue plan of care.  OT Short Term Goals Week 2:  OT Short Term Goal 1 (Week 2): Pt will complete shower transfers with RW vs PFRW with supervision OT Short Term Goal 1 - Progress (Week 2): Met OT Short Term Goal 2 (Week 2): Pt will complete toilet transfer with RW vs PFRW with supervision OT Short Term Goal 2 - Progress (Week 2): Met OT Short Term Goal 3 (Week 2): Pt will complete 2 grooming tasks in standing with supervision OT Short Term Goal 3 - Progress (Week 2): Met Week 3:  OT Short Term Goal 1 (Week 3): STG=LTG secondary to ELOS    Therapy Documentation Precautions: Precautions Precautions: Fall Precaution Comments: Absent R sided proprioception, severe OA R knee-don knee support brace Required Braces or Orthoses: Sling (for R UE support when  OOB) Restrictions Weight Bearing Restrictions: No  See Function Navigator for Current Functional Status.     Leotis Shames [facility] 12/01/2016, 6:47 AM

## 2016-12-01 NOTE — Progress Notes (Signed)
Physical Therapy Weekly Progress Note  Patient Details  Name: [name] MRN: [id number] Date of Birth: 19-Jan-1951  Beginning of progress report period: November 24, 2016 End of progress report period: December 01, 2016  Today's Date: 12/01/2016 PT Individual Time: 0904-1000 PT Individual Time Calculation (min): 56 min    Pt continues to progress towards long term goals and is currently performing all mobility with supervision>steady assist.    Patient continues to demonstrate the following deficits muscle weakness, impaired timing and sequencing, unbalanced muscle activation, ataxia and decreased coordination, decreased attention to right and decreased balance strategies and therefore will continue to benefit from skilled PT intervention to increase functional independence with mobility.  Patient progressing toward long term goals..  Continue plan of care.  PT Short Term Goals Week 3:  PT Short Term Goal 1 (Week 3): =LTGs   Skilled Therapeutic Interventions/Progress Updates:    Pt resting in w/c on arrival, no c/o pain and agreeable to therapy session.  Session focus on gait training with PLS AFO with HW and with LBQC and NMR for reciprocal stepping, RUE attention, and overall activity tolerance.  Pt continues to demonstrate sit<>stand and ambulatory transfers with supervision from a variety of surfaces.  W/C propulsion to and from therapy gym with noted continuing improvement in fluidity with reciprocal stepping pattern, though pt continues to utilize LUE for increased propulsion speed.  Gait training x200' with PLS AFO and hemiwalker with 1 instance of minor R knee buckle, pt requires supervision and min cues for decreasing R step length.  Gait training x100' with [facility] with supervision but increased cues for decreased R step length.  [name] x10 minutes at level 4 for overall activity tolerance, reciprocal stepping pattern, and focus on attention to RUE to maintain grasp on handle  throughout task.  Pt returned to room at end of session and positioned with call bell in reach and needs met.   Therapy Documentation Precautions:  Precautions Precautions: Fall Precaution Comments: Absent R sided proprioception, severe OA R knee-don knee support brace Required Braces or Orthoses: Sling (for R UE support when OOB) Restrictions Weight Bearing Restrictions: No   See Function Navigator for Current Functional Status.  Therapy/Group: Individual Therapy  [name] 12/01/2016, 10:04 AM

## 2016-12-01 NOTE — Progress Notes (Signed)
Physical Therapy Session Note  Patient Details  Name: [name] MRN: [id number] Date of Birth: [birth year]  Today's Date: 12/01/2016 PT Individual Time: 5449-2010 PT Individual Time Calculation (min): 45 min    Short Term Goals: Week 2:  PT Short Term Goal 1 (Week 2): =LTGs due to ELOS  Skilled Therapeutic Interventions/Progress Updates:    Pt resting in w/c on arrival, no c/o pain but notes that a rash on his R knee from his knee brace has gotten larger, RN aware.  Pt agreeable to therapy session focus on w/c propulsion and instruction in stair negotiation to simulate home entry.  PT instructs pt in curb step negotiation x4 with [name] and overall supervision to simulate home entry.  Min cues initially for sequencing, fade to supervision without cues.  W/C propulsion >500' to [employer] I.  Pt negotiates around obstacles, over door sills, up/down incline mod I with occasional short rest breaks.  Pt returned to room at end of session and positioned with call bell in reach and needs met.    Therapy Documentation Precautions:  Precautions Precautions: Fall Precaution Comments: Absent R sided proprioception, severe OA R knee-don knee support brace Required Braces or Orthoses: Sling (for R UE support when OOB) Restrictions Weight Bearing Restrictions: No   See Function Navigator for Current Functional Status.   Therapy/Group: Individual Therapy  Earnest Conroy Penven-Crew 12/01/2016, 3:36 PM

## 2016-12-01 NOTE — Progress Notes (Signed)
Subjective/Complaints: Pt sitting up in his chair.  He slept well overnight and believes he is progressing in therapies.   Review of systems: Denies CP, SOB, N/V/D.  Objective: Vital Signs: Blood pressure 110/66, pulse 78, temperature 98.2 F (36.8 C), temperature source Oral, resp. rate 17, weight 107.3 kg (236 lb 8.9 oz), SpO2 91 %. No results found. Results for orders placed or performed during the hospital encounter of 11/17/16 (from the past 72 hour(s))  Glucose, capillary     Status: None   Collection Time: 11/28/16  4:27 PM  Result Value Ref Range   Glucose-Capillary 78 65 - 99 mg/dL  Glucose, capillary     Status: Abnormal   Collection Time: 11/28/16  8:58 PM  Result Value Ref Range   Glucose-Capillary 100 (H) 65 - 99 mg/dL  Glucose, capillary     Status: None   Collection Time: 11/29/16  6:36 AM  Result Value Ref Range   Glucose-Capillary 98 65 - 99 mg/dL  Glucose, capillary     Status: Abnormal   Collection Time: 11/29/16 11:39 AM  Result Value Ref Range   Glucose-Capillary 102 (H) 65 - 99 mg/dL     General: NAD. Vital signs reviewed.  Psych: Mood and affect are appropriate Heart: RRR. No JVD.  Lungs: Clear to auscultation, breathing unlabored Abdomen: Positive bowel sounds, soft  Skin: Intact. Warm and dry. Neurologic:  Motor: B/l UE 5/5 deltoid, bicep, tricep, grip, hip flexor, knee extensors, ankle dorsiflexor and plantar flexor, proximal RUE 4+-5/5 (stable) RUE Dysmetria noted.  Sensation diminished to light touch RUE/RLE Musculoskeletal: No edema. No tenderness.   Assessment/Plan: 1. 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 Physiatrist is providing close team supervision and 24 hour management of active medical problems listed below. Physiatrist and rehab team continue to assess barriers to discharge/monitor  patient progress toward functional and medical goals. FIM: Function - Bathing Position: Shower Body parts bathed by patient: Right arm, Left arm, Chest, Abdomen, Front perineal area, Right upper leg, Left upper leg, Right lower leg, Left lower leg, Buttocks Body parts bathed by helper: Back Bathing not applicable: Back Assist Level: Supervision or verbal cues  Function- Upper Body Dressing/Undressing What is the patient wearing?: Pull over shirt/dress Pull over shirt/dress - Perfomed by patient: Thread/unthread right sleeve, Thread/unthread left sleeve, Put head through opening, Pull shirt over trunk Assist Level: Supervision or verbal cues Function - Lower Body Dressing/Undressing What is the patient wearing?: Underwear, Pants, Socks, Shoes, AFO Position: Sitting EOB Underwear - Performed by patient: Thread/unthread right underwear leg, Thread/unthread left underwear leg Underwear - Performed by helper: Pull underwear up/down Pants- Performed by patient: Thread/unthread right pants leg, Thread/unthread left pants leg, Pull pants up/down Pants- Performed by helper: Pull pants up/down Socks - Performed by patient: Don/doff right sock, Don/doff left sock Socks - Performed by helper: Don/doff right sock, Don/doff left sock Shoes - Performed by patient: Don/doff right shoe, Don/doff left shoe, Fasten right, Fasten left Shoes - Performed by helper: Don/doff right shoe, Don/doff left shoe, Fasten right, Fasten left AFO - Performed by patient: Don/doff right AFO Assist for footwear: Maximal assist Assist for lower body dressing: Touching or steadying assistance (Pt > 75%)  Function - Toileting Toileting activity did not occur: No continent bowel/bladder event Toileting steps completed by patient: Adjust clothing prior to toileting, Adjust clothing after toileting, Performs perineal hygiene Toileting steps completed  by helper: Adjust clothing prior to toileting, Performs perineal hygiene,  Adjust clothing after toileting Toileting Assistive Devices: Grab bar or rail Assist level: Touching or steadying assistance (Pt.75%)  Function - Toilet Transfers Toilet transfer activity did not occur:  (using urinal, No BM) Toilet transfer assistive device: Elevated toilet seat/BSC over toilet, Grab bar Assist level to toilet: Touching or steadying assistance (Pt > 75%) Assist level from toilet: Touching or steadying assistance (Pt > 75%)  Function - Chair/bed transfer Chair/bed transfer method: Ambulatory Chair/bed transfer assist level: Supervision or verbal cues Chair/bed transfer assistive device: Armrests, Environmental consultantWalker, [employer] Service[employer] CompanyCane, Orthosis Chair/bed transfer details: Verbal cues for technique  Function - Locomotion: Wheelchair Type: Manual Max wheelchair distance: 150 Assist Level: No help, No cues, assistive device, takes more than reasonable amount of time Assist Level: No help, No cues, assistive device, takes more than reasonable amount of time Assist Level: No help, No cues, assistive device, takes more than reasonable amount of time Turns around,maneuvers to table,bed, and toilet,negotiates 3% grade,maneuvers on rugs and over doorsills: No Function - Locomotion: Ambulation Assistive device: Cane-quad, Walker-hemi, Orthosis Max distance: 100 Assist level: Supervision or verbal cues Assist level: Supervision or verbal cues Walk 50 feet with 2 turns activity did not occur: Safety/medical concerns Assist level: Supervision or verbal cues Walk 150 feet activity did not occur: Safety/medical concerns Assist level: Supervision or verbal cues Walk 10 feet on uneven surfaces activity did not occur: Safety/medical concerns  Function - Comprehension Comprehension: Auditory Comprehension assist level: Follows complex conversation/direction with extra time/assistive device  Function - Expression Expression: Verbal Expression assist level: Expresses complex ideas: With no  assist  Function - Social Interaction Social Interaction assist level: Interacts appropriately with others - No medications needed.  Function - Problem Solving Problem solving assist level: Solves complex problems: With extra time  Function - Memory Memory assist level: More than reasonable amount of time Patient normally able to recall (first 3 days only): Current season, Location of own room, Staff names and faces, That he or she is in a hospital  Medical Problem List and Plan: 1.  Functional and mobility deficits secondary to left thalamic hemorrhage  Continue CIR   MRI demonstrating left thalamic hemorrhage, also involving corona radiata, no additional areas of infarct noted,  Carotid Dopplers, negative, recommending follow-up MRI in ~3 months. 2.  DVT Prophylaxis/Anticoagulation: Pharmaceutical: Lovenox 3. Pain Management:  Will add tramadol prn for knee pain/shoulder pain, has evidence of glenohumeral arthritis but also may have some rotator cuff degeneration discussed potential for steroid injection. He does not wish to proceed with this at the current time. Trial FES, added Kpad 4. Mood: Team to provide ego support. Under lot of stress due to business commitments/end of year. LCSW to follow for evaluation and support.  5. Neuropsych: This patient is capable of making decisions on his own behalf. 6. Skin/Wound Care: Routine pressure relief measures. Maintain adequate nutritional and hydration status.  7. Fluids/Electrolytes/Nutrition: Monitor I/O.  8. HTN: Monitor BP bid--on HCTZ and cozaar.   Labile, but overall controlled 12/29 9. 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              -consider supportive/OA knee brace for support  11. Prediabetes: Hgb A1c- 6.3. Added CM restrictions.   Consult RD to educate patient on diet.   Controlled, d/ced CBGs 12. Hyponatremia  Na+  134 on 12/26 (stable)  Controlled  13. Morbid obesity  Body mass index is 37.05 kg/m.  Diet and exercise education  Will cont to encourage weight loss to increase endurance and promote overall health  LOS (Days) 14 A FACE TO FACE EVALUATION WAS PERFORMED  Eunie Lawn Karis Juba 12/01/2016, 12:51 PM

## 2016-12-01 NOTE — Progress Notes (Signed)
Orthopedic Tech Progress Note Patient Details:  [name] 07/09/1951 [id number]  Patient ID: [name], male   DOB: 11/13/1951, 65 y.o.   MRN: [id number]   [name], [name] 12/01/2016, 9:39 AM Called in advanced brace order; spoke with Juanda ChanceKanisha

## 2016-12-01 NOTE — Progress Notes (Signed)
Occupational Therapy Session Note  Patient Details  Name: [name] MRN: [id number] Date of Birth: 10/17/1951  Today's Date: 12/01/2016 OT Individual Time: 1000-1100 OT Individual Time Calculation (min): 60 min     Short Term Goals: Week 3:  OT Short Term Goal 1 (Week 3): STG=LTG secondary to ELOS  Skilled Therapeutic Interventions/Progress Updates:    Pt engaged in BADL retraining including bathing at shower level and dressing with sit<>stand from w/c.  Pt selected clothing and supplies at w/c level before transferring to shower seat.  Pt completed bathing tasks at supervision level with sit<>stand from shower seat and using grab bars.  Pt was able to don socks, shoes, and AFO without assistance this morning.  Pt continues to require assistance pulling up right side of underwear.  Pt completed grooming tasks while standing at sink.  Pt remained in w/c with all needs within reach.  Therapy Documentation Precautions:  Precautions Precautions: Fall Precaution Comments: Absent R sided proprioception, severe OA R knee-don knee support brace Required Braces or Orthoses: Sling (for R UE support when OOB) Restrictions Weight Bearing Restrictions: No Pain:  Pt denied pain  See Function Navigator for Current Functional Status.   Therapy/Group: Individual Therapy  [name], Kaylin Marcon Chappell 12/01/2016, 11:07 AM

## 2016-12-01 NOTE — Progress Notes (Signed)
Occupational Therapy Session Note  Patient Details  Name: [name] MRN: [id number] Date of Birth: [birth year]  Today's Date: 12/01/2016 OT Individual Time: 4270-6237 OT Individual Time Calculation (min): 36 min     Short Term Goals:Week 3:  OT Short Term Goal 1 (Week 3): STG=LTG secondary to ELOS  Skilled Therapeutic Interventions/Progress Updates:    Pt seen for RUE motor control. Pt received in w/c and taken to gym, transferred to mat to assess RUE. AROM is excellent, but pt does have pain with abduction due to old RTC injury.  Provided with Level 2 theraband for scapular stability due to RTC pain. Pt worked on shoulder retraction with forearm pronation 10x, forearm neutral 10x.  Instructed pt to have his family help him with this exercises this weekend.  Pt worked on [employer] of grasp/release beanbags with forearm coordination to move bags from bucket to table.  Discussed R impaired [place] that impacts his work for typing and writing. Suggested exercises to practice with keyboard and mouse he can use on his laptop in his room.   Attempted writing his name. Pt has tip pinch but not able to coordinate 3rd digit to stabilize pen even with red foam handle. Focused tx on making simple lines, slash marks with stabilizing pen.  Pt worked on isolated thumb to finger touch.  Pt's decreased sensation in hand also contributes a great deal to his difficulty managing writing utensils.  Pt self propelled back to room with all needs met.  Therapy Documentation Precautions:  Precautions Precautions: Fall Precaution Comments: Absent R sided proprioception, severe OA R knee-don knee support brace Required Braces or Orthoses: Sling (for R UE support when OOB) Restrictions Weight Bearing Restrictions: No    Vital Signs: Therapy Vitals Pulse Rate: 78 BP: 110/66   Pain: no c/o pain   ADL: ADL ADL Comments: Please see functional navigator for ADL status  See Function Navigator for Current  Functional Status.   Therapy/Group: Individual Therapy  [name] 12/01/2016, 12:51 PM

## 2016-12-02 ENCOUNTER — Inpatient Hospital Stay (HOSPITAL_COMMUNITY): Payer: PPO

## 2016-12-02 ENCOUNTER — Encounter (HOSPITAL_COMMUNITY): Payer: PPO | Admitting: Occupational Therapy

## 2016-12-02 NOTE — Progress Notes (Signed)
Subjective/Complaints: Up in w/c on phone when I entered. Pleased with his progress  ROS: Pt denies fever, rash/itching, headache, blurred or double vision, nausea, vomiting, abdominal pain, diarrhea, chest pain, shortness of breath, palpitations, dysuria, dizziness, neck pain, back pain, bleeding, anxiety, or depression   Objective: Vital Signs: Blood pressure (!) 113/56, pulse 72, temperature 97.8 F (36.6 C), temperature source Oral, resp. rate 17, weight 107.3 kg (236 lb 8.9 oz), SpO2 97 %. No results found. Results for orders placed or performed during the hospital encounter of 11/17/16 (from the past 72 hour(s))  Glucose, capillary     Status: Abnormal   Collection Time: 11/29/16 11:39 AM  Result Value Ref Range   Glucose-Capillary 102 (H) 65 - 99 mg/dL     General: NAD. Vital signs reviewed.  Psych: Mood and affect are appropriate Heart: RRR. No JVD.  Lungs: Clear to auscultation, breathing unlabored Abdomen: Positive bowel sounds, soft  Skin: Intact. Warm and dry. Neurologic:  Motor: B/l UE 5/5 deltoid, bicep, tricep, grip, hip flexor, knee extensors, ankle dorsiflexor and plantar flexor, proximal RUE 4+-5/5 unchanged RUE Dysmetria noted.  Sensation diminished to light touch RUE/RLE Musculoskeletal: No edema. No tenderness.   Assessment/Plan: 1. 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 Physiatrist is providing close team supervision and 24 hour management of active medical problems listed below. Physiatrist and rehab team continue to assess barriers to discharge/monitor patient progress toward functional and medical goals. FIM: Function - Bathing Position: Shower Body parts bathed by patient: Right arm, Left arm, Chest, Abdomen, Front perineal area, Right upper leg, Left upper leg, Right lower leg, Left lower leg,  Buttocks Body parts bathed by helper: Back Bathing not applicable: Back Assist Level: Supervision or verbal cues  Function- Upper Body Dressing/Undressing What is the patient wearing?: Pull over shirt/dress Pull over shirt/dress - Perfomed by patient: Thread/unthread right sleeve, Thread/unthread left sleeve, Put head through opening, Pull shirt over trunk Assist Level: Supervision or verbal cues Function - Lower Body Dressing/Undressing What is the patient wearing?: Underwear, Pants, Socks, Shoes, AFO Position: Sitting EOB Underwear - Performed by patient: Thread/unthread right underwear leg, Thread/unthread left underwear leg Underwear - Performed by helper: Pull underwear up/down Pants- Performed by patient: Thread/unthread right pants leg, Thread/unthread left pants leg, Pull pants up/down Pants- Performed by helper: Pull pants up/down Socks - Performed by patient: Don/doff right sock, Don/doff left sock Socks - Performed by helper: Don/doff right sock, Don/doff left sock Shoes - Performed by patient: Don/doff right shoe, Don/doff left shoe, Fasten right, Fasten left Shoes - Performed by helper: Don/doff right shoe, Don/doff left shoe, Fasten right, Fasten left AFO - Performed by patient: Don/doff right AFO Assist for footwear: Maximal assist Assist for lower body dressing: Touching or steadying assistance (Pt > 75%)  Function - Toileting Toileting activity did not occur: No continent bowel/bladder event Toileting steps completed by patient: Adjust clothing prior to toileting, Adjust clothing after toileting, Performs perineal hygiene Toileting steps completed by helper: Adjust clothing prior to toileting, Performs perineal hygiene, Adjust clothing after toileting Toileting Assistive Devices: Grab bar or rail Assist level: Touching or steadying assistance (Pt.75%)  Function - ArchivistToilet Transfers Toilet transfer activity did not occur:  (using urinal, No BM) Toilet transfer assistive  device: Elevated toilet seat/BSC over toilet, Grab bar Assist level to toilet: Touching or steadying assistance (Pt > 75%) Assist level from toilet: Touching  or steadying assistance (Pt > 75%)  Function - Chair/bed transfer Chair/bed transfer method: Ambulatory Chair/bed transfer assist level: Supervision or verbal cues Chair/bed transfer assistive device: Armrests, Cane, Orthosis Chair/bed transfer details: Verbal cues for technique  Function - Locomotion: Wheelchair Type: Manual Max wheelchair distance: 500 Assist Level: No help, No cues, assistive device, takes more than reasonable amount of time Assist Level: No help, No cues, assistive device, takes more than reasonable amount of time Assist Level: No help, No cues, assistive device, takes more than reasonable amount of time Turns around,maneuvers to table,bed, and toilet,negotiates 3% grade,maneuvers on rugs and over doorsills: Yes Function - Locomotion: Ambulation Assistive device: Cane-quad, Orthosis Max distance: 150 Assist level: Supervision or verbal cues Assist level: Supervision or verbal cues Walk 50 feet with 2 turns activity did not occur: Safety/medical concerns Assist level: Supervision or verbal cues Walk 150 feet activity did not occur: Safety/medical concerns Assist level: Supervision or verbal cues Walk 10 feet on uneven surfaces activity did not occur: Safety/medical concerns  Function - Comprehension Comprehension: Auditory Comprehension assist level: Follows complex conversation/direction with extra time/assistive device  Function - Expression Expression: Verbal Expression assist level: Expresses complex ideas: With no assist  Function - Social Interaction Social Interaction assist level: Interacts appropriately with others - No medications needed.  Function - Problem Solving Problem solving assist level: Solves complex problems: Recognizes & self-corrects  Function - Memory Memory assist level:  More than reasonable amount of time Patient normally able to recall (first 3 days only): Current season, Location of own room, Staff names and faces, That he or she is in a hospital  Medical Problem List and Plan: 1.  Functional and mobility deficits secondary to left thalamic hemorrhage  Continue CIR   MRI demonstrating left thalamic hemorrhage, also involving corona radiata, no additional areas of infarct noted,  Carotid Dopplers, negative, recommending follow-up MRI in ~3 months. 2.  DVT Prophylaxis/Anticoagulation: Pharmaceutical: Lovenox 3. Pain Management:  Will add tramadol prn for knee pain/shoulder pain, has evidence of glenohumeral arthritis but also may have some rotator cuff degeneration discussed potential for steroid injection. He does not wish to proceed with this at the current time. Trial FES, added Kpad 4. Mood: Team to provide ego support. Under lot of stress due to business commitments/end of year. LCSW to follow for evaluation and support.  5. Neuropsych: This patient is capable of making decisions on his own behalf. 6. Skin/Wound Care: Routine pressure relief measures. Maintain adequate nutritional and hydration status.  7. Fluids/Electrolytes/Nutrition: Monitor I/O.  8. HTN: Monitor BP bid--on HCTZ and cozaar.   Labile, but overall better control 9. 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              -consider supportive/OA knee brace for support  11. Prediabetes: Hgb A1c- 6.3. Added CM restrictions.   Consult RD to educate patient on diet.   Controlled, d/ced CBGs 12. Hyponatremia  Na+ 134 on 12/26 (stable)  Controlled 13. Morbid obesity  Body mass index is 37.05 kg/m.  Diet and exercise education  Will cont to encourage weight loss to increase endurance and promote overall health  LOS (Days) 15 A FACE TO FACE EVALUATION WAS PERFORMED  SWARTZ,ZACHARY  T 12/02/2016, 8:47 AM

## 2016-12-02 NOTE — Progress Notes (Signed)
Occupational Therapy Session Note  Patient Details  Name: [name] MRN: [id number] Date of Birth: [birth year]  Today's Date: 12/02/2016 OT Group Time:  - 1415-1533 Group Time Calculation: 78 min    Short Term Goals: Week 1:  OT Short Term Goal 1 (Week 1): Pt will complete toilet transfers and LRAD with Min A OT Short Term Goal 1 - Progress (Week 1): Met OT Short Term Goal 2 (Week 1): Pt will complete LB dressing with Max A OT Short Term Goal 2 - Progress (Week 1): Met OT Short Term Goal 3 (Week 1): Pt will complete bathing with Min A OT Short Term Goal 3 - Progress (Week 1): Met OT Short Term Goal 4 (Week 1): Pt will complete shower transfer with Min A and LRAD OT Short Term Goal 4 - Progress (Week 1): Met OT Short Term Goal 5 (Week 1): Pt will utilize R UE during ADL routine with min multimodal cues OT Short Term Goal 5 - Progress (Week 1): Met Week 2:  OT Short Term Goal 1 (Week 2): Pt will complete shower transfers with RW vs PFRW with supervision OT Short Term Goal 1 - Progress (Week 2): Met OT Short Term Goal 2 (Week 2): Pt will complete toilet transfer with RW vs PFRW with supervision OT Short Term Goal 2 - Progress (Week 2): Met OT Short Term Goal 3 (Week 2): Pt will complete 2 grooming tasks in standing with supervision OT Short Term Goal 3 - Progress (Week 2): Met Week 3:  OT Short Term Goal 1 (Week 3): STG=LTG secondary to ELOS  Skilled Therapeutic Interventions/Progress Updates:   Pt participated in group tx session focusing on R UE NMR, standing balance, and standing endurance. Pt was seated in w/c at time of arrival, agreeable to tx. He self propelled to therapy gym while using R UE for grasp/release practice. Education emphasis placed on always visually attending to R UE when engaging it functionally for safety (due to sensation/proprioceptive deficits). Pt verbalized and demonstrated understanding during task. Once in gym, pt transferred to [name] with hemi walker and  supervision. He participated in standing wii activity while engaged with R UE during task. Pt occasionally dropped controller (did not leave arm due to wrist strap) but demonstrated functional wrist flexion/extension for activity demands. Afterwards he was taken to [name] to participate in standing game on foam cushion for upgrading balance demands. Pt used R UE throughout task, emphasis on grasp/releasing as well as pinching. Pt was then returned to room and left with all needs within reach and wife [name] present.   Therapy Documentation Precautions:  Precautions Precautions: Fall Precaution Comments: Absent R sided proprioception, severe OA R knee-don knee support brace Required Braces or Orthoses: Sling (for R UE support when OOB) Restrictions Weight Bearing Restrictions: No General:   Vital Signs: Therapy Vitals Temp: 98.3 F (36.8 C) Temp Source: Oral Pulse Rate: 69 Resp: 16 BP: 92/66 Patient Position (if appropriate): Sitting Oxygen Therapy SpO2: 94 % O2 Device: Not Delivered Pain: No c/o pain during session    ADL: ADL ADL Comments: Please see functional navigator for ADL status    See Function Navigator for Current Functional Status.   Therapy/Group: Group Therapy  [name] A [name] 12/02/2016, 5:21 PM

## 2016-12-02 NOTE — Progress Notes (Signed)
Occupational Therapy Session Note  Patient Details  Name: Justin FillerStephen [name] MRN: [id number] Date of Birth: 09/16/1951  Today's Date: 12/02/2016 OT Individual Time: 0925-1005 OT Individual Time Calculation (min): 40 min   Short Term Goals: Week 3:  OT Short Term Goal 1 (Week 3): STG=LTG secondary to ELOS  Skilled Therapeutic Interventions/Progress Updates:   ADL-retraining at shower level with focus on adapted bathing/dressing skills and AE training with LH sponge.   Pt received with RN providing meds.   Pt was observed using his left hand for meds and was encouraged to incorporate RUE throughout session.   Pt ambulated to bathroom using quad-tipped cane and disrobed sitting at tub bench with supervision to manage RLE AFO.   Overall, pt completed bathing with distant supervision using LH sponge to wash his back.   Pt recovered to w/c and was escorted to sink to dress, requiring moderate assist only d/t time constraints as patient reports that he is able to don his socks and shoes with extra time.   Pt left in w/c at sink to groom unassisted at end of session.  Therapy Documentation Precautions:  Precautions Precautions: Fall Precaution Comments: Absent R sided proprioception, severe OA R knee-don knee support brace Required Braces or Orthoses: Sling (for R UE support when OOB) Restrictions Weight Bearing Restrictions: No  Pain: No/denies pain    ADL: ADL ADL Comments: Please see functional navigator for ADL status   See Function Navigator for Current Functional Status.   Therapy/Group: Individual Therapy   Second session: Time:  1100-1200 Group Time (min):  60  min  Pain Assessment: No/denies pain    Skilled Therapeutic Interventions: Therapeutic group activity with focus on repeated sit<>stand, dynamic standing balance, RUE coordination and right hand FMC using Wii controller while playing 1 game of bowling.    Pt completed game with intermittent physical assist to reset  controller d/t decreased sensation (light touch) however pt developed compensatory strategy during task and appeared to enjoy challenge.   Pt was then educated on balance deficits using balance board and Fit Plus program to provide insights relating to weight-shifting and center of balance.   Pt completed awareness test and balance test with contact guard after OT provided setup to calibrate balance board for his participation.   Pt was escorted back to his room at end of session in prep for lunch by rehab tech.    See FIM for current functional status  Therapy/Group: GroupTherapy  Third session: Time: 1300-1330 Time Calculation (min): 30   min  Pain Assessment: No/denies pain   Skilled Therapeutic Interventions: Therapeutic activity with focus on [facility] right [facility], functional mobility using quad-tipped cane, and endurance.   Pt received seated in w/c and awaiting therapist.   Pt requested continued practice with cane and ambulated to rehab gym with min vc for stride/pace.   Pt was [facility] on [facility] and completed standing and sitting task, assembly of nuts and bolts by hand and using tool (socket, ratchet and combination wrench).   Pt admitted to mild frustration with task but persevered through 30 min session, demonstrated improved coordination with practice.  Pt was escorted back to his room and provided 1 large bolt and nut to continue practice with manipulation, sensory re-ed, and FMC of right hand.   See FIM for current functional status  Therapy/Group: Individual Therapy  [name] 12/02/2016, 10:27 AM

## 2016-12-03 NOTE — Progress Notes (Signed)
Subjective/Complaints: No new issues. Feeling well. Very appreciative of rehab program. Working on exercises for his right hand.   Objective: Vital Signs: Blood pressure 140/80, pulse 63, temperature 98.1 F (36.7 C), temperature source Oral, resp. rate 18, weight 107.3 kg (236 lb 8.9 oz), SpO2 97 %. No results found. No results found for this or any previous visit (from the past 72 hour(s)).   General: NAD. Vital signs reviewed.  Psych: Mood and affect are appropriate Heart: RRR. No JVD.  Lungs: clear, unlabored Abdomen: Positive bowel sounds, soft  Skin: Intact. Warm and dry. Neurologic:  Motor: B/l UE 5/5 deltoid, bicep, tricep, grip, hip flexor, knee extensors, ankle dorsiflexor and plantar flexor, proximal RUE 4+-5/5--stable RUE Dysmetria noted.  Sensation diminished to light touch RUE/RLE Musculoskeletal: No edema. No tenderness.   Assessment/Plan: 1. 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 Physiatrist is providing close team supervision and 24 hour management of active medical problems listed below. Physiatrist and rehab team continue to assess barriers to discharge/monitor patient progress toward functional and medical goals. FIM: Function - Bathing Position: Shower Body parts bathed by patient: Left arm, Right arm, Chest, Abdomen, Front perineal area, Buttocks, Right upper leg, Left upper leg, Right lower leg, Left lower leg, Back Body parts bathed by helper: Back Bathing not applicable: Back Assist Level: Set up  Function- Upper Body Dressing/Undressing What is the patient wearing?: Button up shirt Pull over shirt/dress - Perfomed by patient: Thread/unthread right sleeve, Thread/unthread left sleeve, Put head through opening, Pull shirt over trunk Assist Level: Supervision or verbal cues Function - Lower Body  Dressing/Undressing What is the patient wearing?: Underwear, Pants, Socks, Shoes, AFO Position: Wheelchair/chair at sink Underwear - Performed by patient: Thread/unthread right underwear leg, Thread/unthread left underwear leg Underwear - Performed by helper: Pull underwear up/down Pants- Performed by patient: Thread/unthread right pants leg, Thread/unthread left pants leg Pants- Performed by helper: Pull pants up/down Socks - Performed by patient: Don/doff right sock, Don/doff left sock Socks - Performed by helper: Don/doff right sock, Don/doff left sock Shoes - Performed by patient: Don/doff right shoe, Don/doff left shoe, Fasten right, Fasten left Shoes - Performed by helper: Don/doff right shoe, Don/doff left shoe, Fasten right, Fasten left AFO - Performed by patient: Don/doff right AFO AFO - Performed by helper: Don/doff right AFO Assist for footwear: Maximal assist Assist for lower body dressing:  (moderate assist d/[name] time constraints)  Function - Toileting Toileting activity did not occur: N/A Toileting steps completed by patient: Adjust clothing prior to toileting, Adjust clothing after toileting, Performs perineal hygiene Toileting steps completed by helper: Adjust clothing prior to toileting, Performs perineal hygiene, Adjust clothing after toileting Toileting Assistive Devices: Grab bar or rail Assist level: Touching or steadying assistance (Pt.75%)  Function - ArchivistToilet Transfers Toilet transfer activity did not occur:  (using urinal, No BM) Toilet transfer assistive device: Elevated toilet seat/BSC over toilet, Grab bar Assist level to toilet: Touching or steadying assistance (Pt > 75%) Assist level from toilet: Touching or steadying assistance (Pt > 75%)  Function - Chair/bed transfer Chair/bed transfer method: Ambulatory Chair/bed transfer assist level: Supervision or verbal cues Chair/bed transfer assistive device: Armrests, Walker, Orthosis Chair/bed transfer details:  Verbal cues for technique  Function - Locomotion: Wheelchair Type: Manual Max wheelchair distance: 500 Assist Level: No help, No cues, assistive device, takes more than reasonable amount of time Assist Level: No  help, No cues, assistive device, takes more than reasonable amount of time Assist Level: No help, No cues, assistive device, takes more than reasonable amount of time Turns around,maneuvers to table,bed, and toilet,negotiates 3% grade,maneuvers on rugs and over doorsills: Yes Function - Locomotion: Ambulation Assistive device: Cane-quad, Orthosis Max distance: 150 Assist level: Supervision or verbal cues Assist level: Supervision or verbal cues Walk 50 feet with 2 turns activity did not occur: Safety/medical concerns Assist level: Supervision or verbal cues Walk 150 feet activity did not occur: Safety/medical concerns Assist level: Supervision or verbal cues Walk 10 feet on uneven surfaces activity did not occur: Safety/medical concerns  Function - Comprehension Comprehension: Auditory Comprehension assist level: Follows complex conversation/direction with no assist  Function - Expression Expression: Verbal Expression assist level: Expresses complex ideas: With no assist  Function - Social Interaction Social Interaction assist level: Interacts appropriately with others - No medications needed.  Function - Problem Solving Problem solving assist level: Solves complex problems: Recognizes & self-corrects  Function - Memory Memory assist level: More than reasonable amount of time Patient normally able to recall (first 3 days only): Current season, Location of own room, Staff names and faces, That he or she is in a hospital  Medical Problem List and Plan: 1.  Functional and mobility deficits secondary to left thalamic hemorrhage  Continue CIR therapies. Pt motivated   MRI demonstrating left thalamic hemorrhage, also involving corona radiata, no additional areas of infarct  noted,  Carotid Dopplers, negative, recommending follow-up MRI in ~3 months. 2.  DVT Prophylaxis/Anticoagulation: Pharmaceutical: Lovenox 3. Pain Management:  Will add tramadol prn for knee pain/shoulder pain, has evidence of glenohumeral arthritis but also may have some rotator cuff degeneration discussed potential for steroid injection. He does not wish to proceed with this at the current time. Trial FES, added Kpad 4. Mood: Team to provide ego support. Under lot of stress due to business commitments/end of year. LCSW to follow for evaluation and support.  5. Neuropsych: This patient is capable of making decisions on his own behalf. 6. Skin/Wound Care: Routine pressure relief measures. Maintain adequate nutritional and hydration status.  7. Fluids/Electrolytes/Nutrition: Monitor I/O.  8. HTN: Monitor BP bid--on HCTZ and cozaar.   Labile, but overall better control 9. 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              -consider supportive/OA knee brace for support  11. Prediabetes: Hgb A1c- 6.3. Added CM restrictions.   Consult RD to educate patient on diet.   Controlled, d/ced CBGs 12. Hyponatremia  Na+ 134 on 12/26 (stable)  Controlled 13. Morbid obesity  Body mass index is 37.05 kg/m.  Diet and exercise education  Will cont to encourage weight loss to increase endurance and promote overall health  LOS (Days) 16 A FACE TO FACE EVALUATION WAS PERFORMED  [name] 12/03/2016, 8:22 AM

## 2016-12-04 ENCOUNTER — Encounter (HOSPITAL_COMMUNITY): Payer: PPO

## 2016-12-04 DIAGNOSIS — R413 Other amnesia: Secondary | ICD-10-CM

## 2016-12-04 NOTE — Progress Notes (Signed)
Subjective/Complaints:  No issues overnite Pt feels like bowels ok although every other day Seeing Neuropsych this am  ROS: neg CP, SOB, N/V/D  Objective: Vital Signs: Blood pressure 127/61, pulse 63, temperature 97.7 F (36.5 C), temperature source Oral, resp. rate 18, weight 107.3 kg (236 lb 8.9 oz), SpO2 97 %. No results found. No results found for this or any previous visit (from the past 72 hour(s)).   General: NAD. Vital signs reviewed.  Psych: Mood and affect are appropriate Heart: RRR. No JVD.  Lungs: clear, unlabored Abdomen: Positive bowel sounds, soft  Skin: Intact. Warm and dry. Neurologic:  Motor: B/l UE 5/5 deltoid, bicep, tricep, grip, hip flexor, knee extensors, ankle dorsiflexor and plantar flexor, proximal RUE 4+-5/5--stable Sensory ataxia RUE RUE Dysmetria noted.  Sensation diminished to light touch RUE/RLE Musculoskeletal: No edema. No tenderness.   Assessment/Plan: 1. 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 Physiatrist is providing close team supervision and 24 hour management of active medical problems listed below. Physiatrist and rehab team continue to assess barriers to discharge/monitor patient progress toward functional and medical goals. FIM: Function - Bathing Position: Shower Body parts bathed by patient: Left arm, Right arm, Chest, Abdomen, Front perineal area, Buttocks, Right upper leg, Left upper leg, Right lower leg, Left lower leg, Back Body parts bathed by helper: Back Bathing not applicable: Back Assist Level: Set up  Function- Upper Body Dressing/Undressing What is the patient wearing?: Button up shirt Pull over shirt/dress - Perfomed by patient: Thread/unthread right sleeve, Thread/unthread left sleeve, Put head through opening, Pull shirt over trunk Assist Level: Supervision or verbal  cues Function - Lower Body Dressing/Undressing What is the patient wearing?: Underwear, Pants, Socks, Shoes, AFO Position: Wheelchair/chair at sink Underwear - Performed by patient: Thread/unthread right underwear leg, Thread/unthread left underwear leg Underwear - Performed by helper: Pull underwear up/down Pants- Performed by patient: Thread/unthread right pants leg, Thread/unthread left pants leg Pants- Performed by helper: Pull pants up/down Socks - Performed by patient: Don/doff right sock, Don/doff left sock Socks - Performed by helper: Don/doff right sock, Don/doff left sock Shoes - Performed by patient: Don/doff right shoe, Don/doff left shoe, Fasten right, Fasten left Shoes - Performed by helper: Don/doff right shoe, Don/doff left shoe, Fasten right, Fasten left AFO - Performed by patient: Don/doff right AFO AFO - Performed by helper: Don/doff right AFO Assist for footwear: Maximal assist Assist for lower body dressing:  (moderate assist d/t time constraints)  Function - Toileting Toileting activity did not occur: N/A Toileting steps completed by patient: Adjust clothing prior to toileting, Adjust clothing after toileting, Performs perineal hygiene Toileting steps completed by helper: Adjust clothing prior to toileting, Performs perineal hygiene, Adjust clothing after toileting Toileting Assistive Devices: Grab bar or rail Assist level: Touching or steadying assistance (Pt.75%)  Function - Archivist transfer activity did not occur:  (using urinal, No BM) Toilet transfer assistive device: Elevated toilet seat/BSC over toilet, Grab bar Assist level to toilet: Touching or steadying assistance (Pt > 75%) Assist level from toilet: Touching or steadying assistance (Pt > 75%)  Function - Chair/bed transfer Chair/bed transfer method: Stand pivot Chair/bed transfer assist level: Supervision or verbal cues Chair/bed transfer assistive device: Armrests Chair/bed transfer  details: Verbal cues for technique  Function - Locomotion: Wheelchair Type: Manual Max wheelchair distance: 500 Assist Level: No help, No cues, assistive device, takes more than  reasonable amount of time Assist Level: No help, No cues, assistive device, takes more than reasonable amount of time Assist Level: No help, No cues, assistive device, takes more than reasonable amount of time Turns around,maneuvers to table,bed, and toilet,negotiates 3% grade,maneuvers on rugs and over doorsills: Yes Function - Locomotion: Ambulation Assistive device: Cane-quad, Orthosis Max distance: 150 Assist level: Supervision or verbal cues Assist level: Supervision or verbal cues Walk 50 feet with 2 turns activity did not occur: Safety/medical concerns Assist level: Supervision or verbal cues Walk 150 feet activity did not occur: Safety/medical concerns Assist level: Supervision or verbal cues Walk 10 feet on uneven surfaces activity did not occur: Safety/medical concerns  Function - Comprehension Comprehension: Auditory Comprehension assist level: Follows complex conversation/direction with no assist  Function - Expression Expression: Verbal Expression assist level: Expresses complex ideas: With no assist  Function - Social Interaction Social Interaction assist level: Interacts appropriately with others - No medications needed.  Function - Problem Solving Problem solving assist level: Solves complex problems: Recognizes & self-corrects  Function - Memory Memory assist level: More than reasonable amount of time Patient normally able to recall (first 3 days only): Current season, Location of own room, Staff names and faces, That he or she is in a hospital  Medical Problem List and Plan: 1.  Functional and mobility deficits secondary to left thalamic hemorrhage  Continue CIR therapies. Pt motivated   MRI demonstrating left thalamic hemorrhage, also involving corona radiata, no additional areas of  infarct noted,  Carotid Dopplers, negative, recommending follow-up MRI in ~3 months.F/u with Neuro as Outpt                                           2.  DVT Prophylaxis/Anticoagulation: Pharmaceutical: Lovenox 3. Pain Management:  Will add tramadol prn for knee pain/shoulder pain, has evidence of glenohumeral arthritis but also may have some rotator cuff degeneration discussed potential for steroid injection. He does not wish to proceed with this at the current time. Trial FES, added Kpad 4. Mood: Team to provide ego support. Under lot of stress due to business commitments/end of year. LCSW to follow for evaluation and support.                                                                                                                                                                  5. Neuropsych: This patient is capable of making decisions on his own behalf. 6. Skin/Wound Care: Routine pressure relief measures. Maintain adequate nutritional and hydration status.  7. Fluids/Electrolytes/Nutrition: Monitor I/O.  8. HTN: Monitor BP bid--on HCTZ and cozaar.   Labile, but overall better control Vitals:  12/03/16 1738 12/04/16 0506  BP: 128/72 127/61  Pulse: 72 63  Resp: 18 18  Temp: 98.4 F (36.9 C) 97.7 F (36.5 C)   9. 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              -consider supportive/OA knee brace for support  11. Prediabetes: Hgb A1c- 6.3. Added CM restrictions.   Consult RD to educate patient on diet.   Controlled, d/ced CBGs 12. Hyponatremia  Na+ 134 on 12/26 (stable)  Controlled 13. Morbid obesity  Body mass index is 37.05 kg/m.  Diet and exercise education  Will cont to encourage weight loss to increase endurance and promote overall health  LOS (Days) 17 A FACE TO FACE EVALUATION WAS PERFORMED  Neal Oshea E 12/04/2016, 8:14 AM

## 2016-12-05 ENCOUNTER — Inpatient Hospital Stay (HOSPITAL_COMMUNITY): Payer: PPO

## 2016-12-05 ENCOUNTER — Inpatient Hospital Stay (HOSPITAL_COMMUNITY): Payer: PPO | Admitting: Physical Therapy

## 2016-12-05 DIAGNOSIS — I69319 Unspecified symptoms and signs involving cognitive functions following cerebral infarction: Secondary | ICD-10-CM | POA: Insufficient documentation

## 2016-12-05 LAB — CBC
HCT: 42.5 % (ref 39.0–52.0)
HEMOGLOBIN: 14.2 g/dL (ref 13.0–17.0)
MCH: 30.5 pg (ref 26.0–34.0)
MCHC: 33.4 g/dL (ref 30.0–36.0)
MCV: 91.2 fL (ref 78.0–100.0)
Platelets: 265 10*3/uL (ref 150–400)
RBC: 4.66 MIL/uL (ref 4.22–5.81)
RDW: 12.3 % (ref 11.5–15.5)
WBC: 6.7 10*3/uL (ref 4.0–10.5)

## 2016-12-05 LAB — BASIC METABOLIC PANEL
ANION GAP: 8 (ref 5–15)
BUN: 15 mg/dL (ref 6–20)
CALCIUM: 9 mg/dL (ref 8.9–10.3)
CHLORIDE: 98 mmol/L — AB (ref 101–111)
CO2: 26 mmol/L (ref 22–32)
Creatinine, Ser: 0.87 mg/dL (ref 0.61–1.24)
GFR calc non Af Amer: >60 mL/min (ref >60)
Glucose, Bld: 90 mg/dL (ref 65–99)
POTASSIUM: 3.5 mmol/L (ref 3.5–5.1)
Sodium: 132 mmol/L — ABNORMAL LOW (ref 135–145)

## 2016-12-05 MED ORDER — HYDROCORTISONE 1 % EX CREA
TOPICAL_CREAM | Freq: Three times a day (TID) | CUTANEOUS | Status: DC
Start: 2016-12-05 — End: 2016-12-07
  Administered 2016-12-05 – 2016-12-06 (×5): via TOPICAL
  Filled 2016-12-05: qty 28

## 2016-12-05 NOTE — Progress Notes (Signed)
Subjective/Complaints:   No issues overnite, discussed Neuropsych eval as well as rec for f/u Neuropsych testing given return to work goals  ROS: neg CP, SOB, N/V/D  Objective: Vital Signs: Blood pressure 135/67, pulse 64, temperature 98.3 F (36.8 C), temperature source Oral, resp. rate 18, weight 107.3 kg (236 lb 8.9 oz), SpO2 98 %. No results found. No results found for this or any previous visit (from the past 72 hour(s)).   General: NAD. Vital signs reviewed.  Psych: Mood and affect are appropriate Heart: RRR. No JVD.  Lungs: clear, unlabored Abdomen: Positive bowel sounds, soft  Skin: Intact. Warm and dry. Neurologic:  Motor: B/l UE 5/5 deltoid, bicep, tricep, grip, hip flexor, knee extensors, ankle dorsiflexor and plantar flexor, proximal RUE 4+-5/5--stable Sensory ataxia RUE RUE Dysmetria noted.  Sensation diminished to light touch RUE/RLE Musculoskeletal: No edema. No tenderness.   Assessment/Plan: 1. 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 Physiatrist is providing close team supervision and 24 hour management of active medical problems listed below. Physiatrist and rehab team continue to assess barriers to discharge/monitor patient progress toward functional and medical goals. FIM: Function - Bathing Position: Shower Body parts bathed by patient: Left arm, Right arm, Chest, Abdomen, Front perineal area, Buttocks, Right upper leg, Left upper leg, Right lower leg, Left lower leg, Back Body parts bathed by helper: Back Bathing not applicable: Back Assist Level: Set up  Function- Upper Body Dressing/Undressing What is the patient wearing?: Button up shirt Pull over shirt/dress - Perfomed by patient: Thread/unthread right sleeve, Thread/unthread left sleeve, Put head through opening, Pull shirt over trunk Assist Level:  Supervision or verbal cues Function - Lower Body Dressing/Undressing What is the patient wearing?: Underwear, Pants, Socks, Shoes, AFO Position: Wheelchair/chair at sink Underwear - Performed by patient: Thread/unthread right underwear leg, Thread/unthread left underwear leg Underwear - Performed by helper: Pull underwear up/down Pants- Performed by patient: Thread/unthread right pants leg, Thread/unthread left pants leg Pants- Performed by helper: Pull pants up/down Socks - Performed by patient: Don/doff right sock, Don/doff left sock Socks - Performed by helper: Don/doff right sock, Don/doff left sock Shoes - Performed by patient: Don/doff right shoe, Don/doff left shoe, Fasten right, Fasten left Shoes - Performed by helper: Don/doff right shoe, Don/doff left shoe, Fasten right, Fasten left AFO - Performed by patient: Don/doff right AFO AFO - Performed by helper: Don/doff right AFO Assist for footwear: Maximal assist Assist for lower body dressing:  (moderate assist d/t time constraints)  Function - Toileting Toileting activity did not occur: N/A Toileting steps completed by patient: Adjust clothing prior to toileting, Adjust clothing after toileting, Performs perineal hygiene Toileting steps completed by helper: Adjust clothing prior to toileting, Performs perineal hygiene, Adjust clothing after toileting Toileting Assistive Devices: Grab bar or rail Assist level: Touching or steadying assistance (Pt.75%)  Function - Archivist transfer activity did not occur:  (using urinal, No BM) Toilet transfer assistive device: Elevated toilet seat/BSC over toilet, Grab bar Assist level to toilet: Touching or steadying assistance (Pt > 75%) Assist level from toilet: Touching or steadying assistance (Pt > 75%)  Function - Chair/bed transfer Chair/bed transfer method: Stand pivot Chair/bed transfer assist level: Supervision or verbal cues Chair/bed transfer assistive device:  Armrests Chair/bed transfer details: Verbal cues for technique  Function - Locomotion: Wheelchair Type: Manual Max wheelchair distance: 500 Assist Level: No help, No cues, assistive  device, takes more than reasonable amount of time Assist Level: No help, No cues, assistive device, takes more than reasonable amount of time Assist Level: No help, No cues, assistive device, takes more than reasonable amount of time Turns around,maneuvers to table,bed, and toilet,negotiates 3% grade,maneuvers on rugs and over doorsills: Yes Function - Locomotion: Ambulation Assistive device: Cane-quad, Orthosis Max distance: 150 Assist level: Supervision or verbal cues Assist level: Supervision or verbal cues Walk 50 feet with 2 turns activity did not occur: Safety/medical concerns Assist level: Supervision or verbal cues Walk 150 feet activity did not occur: Safety/medical concerns Assist level: Supervision or verbal cues Walk 10 feet on uneven surfaces activity did not occur: Safety/medical concerns  Function - Comprehension Comprehension: Auditory Comprehension assist level: Follows complex conversation/direction with no assist  Function - Expression Expression: Verbal Expression assist level: Expresses complex ideas: With no assist  Function - Social Interaction Social Interaction assist level: Interacts appropriately with others - No medications needed.  Function - Problem Solving Problem solving assist level: Solves complex problems: Recognizes & self-corrects  Function - Memory Memory assist level: More than reasonable amount of time Patient normally able to recall (first 3 days only): Current season, Location of own room, Staff names and faces, That he or she is in a hospital  Medical Problem List and Plan: 1.  Functional and mobility deficits secondary to left thalamic hemorrhage  Continue CIR therapies. Pt motivated   MRI demonstrating left thalamic hemorrhage, also involving corona  radiata, no additional areas of infarct noted,  Carotid Dopplers, negative, recommending follow-up MRI in ~3 months.F/u with Neuro as Outpt                                           2.  DVT Prophylaxis/Anticoagulation: Pharmaceutical: Lovenox 3. Pain Management:  Will add tramadol prn for knee pain/shoulder pain, has evidence of glenohumeral arthritis but also may have some rotator cuff degeneration discussed potential for steroid injection. He does not wish to proceed with this at the current time. Trial FES, added Kpad 4. Mood: Team to provide ego support. Under lot of stress due to business commitments/end of year. LCSW to follow for evaluation and support.                                                                                                                                                                  5. Neuropsych: This patient is capable of making decisions on his own behalf. 6. Skin/Wound Care: Routine pressure relief measures. Maintain adequate nutritional and hydration status.  7. Fluids/Electrolytes/Nutrition: Monitor I/O.  8. HTN: Monitor BP bid--on HCTZ and cozaar.   No longer  labile Vitals:   12/04/16 1357 12/05/16 0421  BP: 129/60 135/67  Pulse: 69 64  Resp: 18 18  Temp: 98.5 F (36.9 C) 98.3 F (36.8 C)   9. 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              -consider supportive/OA knee brace for support  11. Prediabetes: Hgb A1c- 6.3. Added CM restrictions.   Consult RD to educate patient on diet.   Controlled, d/ced CBGs 12. Hyponatremia  Na+ 134 on 12/26 (stable)  Controlled 13. Morbid obesity  Body mass index is 37.05 kg/m.  Diet and exercise education  Will cont to encourage weight loss to increase endurance and promote overall health  LOS (Days) 18 A FACE TO FACE EVALUATION WAS PERFORMED  KIRSTEINS,ANDREW E 12/05/2016, 7:35 AM

## 2016-12-05 NOTE — Progress Notes (Signed)
Physical Therapy Session Note  Patient Details  Name: [name] MRN: [id number] Date of Birth: 31-Jan-1951  Today's Date: 12/05/2016 PT Individual Time: 0904-1003 PT Individual Time Calculation (min): 59 min    Short Term Goals: Week 2:  PT Short Term Goal 1 (Week 2): =LTGs due to ELOS  Skilled Therapeutic Interventions/Progress Updates:  Pt resting in w/c on arrival, no c/o pain and agreeable to therapy session.  Session focus on LE NMR, balance, weight shifting, and RUE coordination.  Pt propelled w/c to therapy gym with BLEs for reciprocal stepping pattern, noted improvement in R knee extension and pull through compared to earlier in LOS.  Gait training without AD for increased challenge to balance with overall steady assist, occasional bouts of close supervision and verbal cues for equal step length on RLE/LLE.  NMR/standing balance focus on weight shift to R reaching to R for horseshoes.  NMR for RUE coordination in seated cup stacking/unstacking activity.  PT administered BERG Balance Scale and patient demonstrates significant fall risk as noted by score of 45/56 on Berg Balance Scale.  Recommending use of LBQC and supervision with ambulation at d/c.  Discussed results with pt and that improving score by 1 point would place him in next category and at a lower risk of falling.  Discussed outpatient follow up and progression of ambulation without a device in a safe manner.  Pt verbalized understanding of all.  Returned to room with supervision using [facility].  Left upright in w/c with call bell in reach and needs met.    Therapy Documentation Precautions:  Precautions Precautions: Fall Precaution Comments: Absent R sided proprioception, severe OA R knee-don knee support brace Required Braces or Orthoses: Sling (for R UE support when OOB) Restrictions Weight Bearing Restrictions: No Balance: Standardized Balance Assessment Standardized Balance Assessment: Berg Balance Test Berg Balance  Test Sit to Stand: Able to stand without using hands and stabilize independently Standing Unsupported: Able to stand safely 2 minutes Sitting with Back Unsupported but Feet Supported on Floor or Stool: Able to sit safely and securely 2 minutes Stand to Sit: Controls descent by using hands Transfers: Able to transfer safely, minor use of hands Standing Unsupported with Eyes Closed: Able to stand 10 seconds safely Standing Ubsupported with Feet Together: Able to place feet together independently and stand for 1 minute with supervision From Standing, Reach Forward with Outstretched Arm: Can reach confidently >25 cm (10") From Standing Position, Pick up Object from Floor: Able to pick up shoe safely and easily From Standing Position, Turn to Look Behind Over each Shoulder: Looks behind one side only/other side shows less weight shift Turn 360 Degrees: Able to turn 360 degrees safely but slowly Standing Unsupported, Alternately Place Feet on Step/Stool: Able to complete >2 steps/needs minimal assist Standing Unsupported, One Foot in Front: Able to take small step independently and hold 30 seconds Standing on One Leg: Able to lift leg independently and hold 5-10 seconds Total Score: 45  See Function Navigator for Current Functional Status.   Therapy/Group: Individual Therapy  Earnest Conroy Penven-Crew 12/05/2016, 9:53 AM

## 2016-12-05 NOTE — Progress Notes (Signed)
Physical Therapy Session Note  Patient Details  Name: [name] MRN: [id number] Date of Birth: 10/17/1951  Today's Date: 12/05/2016 PT Individual Time: 1330-1500 PT Individual Time Calculation (min): 90 min    Short Term Goals: Week 3:  PT Short Term Goal 1 (Week 3): =LTGs   Skilled Therapeutic Interventions/Progress Updates:    Session focused on gait training with [facility] with focus on gait pattern with close supervision to steadying assist, w/c mobility with BLE for coordination and strengthening, stair negotiation training with [facility] for curb step to simulate home environment entry (steadying assist needed), and neuro re-ed for balance strategy retraining using maxi sky walking sling for safety while engaging in various balance challenges to promote stepping strategies, coordination, and postural control (ball toss/catch, kicking ball, sidestepping, gait forward/backward). Discussed recommendation for family education tomorrow prior to discharge with plan for wife to come in tomorrow for PT/OT session. Co-treatment with Rec Therapy to address community reintegration and return to home. Community mobility training in gift shop off unit at close supervision to steadying assist navigating obstacles using [facility].   Therapy Documentation Precautions:  Precautions Precautions: Fall Precaution Comments: Absent R sided proprioception, severe OA R knee-don knee support brace Required Braces or Orthoses: Sling (for R UE support when OOB) Restrictions Weight Bearing Restrictions: No  Pain: Denies pain.  See Function Navigator for Current Functional Status.   Therapy/Group: Individual Therapy  Karolee StampsGray, Lanna Labella Darrol PokeBrescia  Abbigail Anstey B. Zelia Yzaguirre, PT, DPT  12/05/2016, 3:45 PM

## 2016-12-05 NOTE — Consult Note (Signed)
  NEUROBEHAVIORAL STATUS EXAM - CONFIDENTIAL [name] Inpatient Rehabilitation   MEDICAL NECESSITY:  Justin FootStephen [name] for a neurobehavioral status exam owing to the patient's diagnosis of CVA, and to assist in treatment planning during admission.   Records indicate that [name] is a "66 year old RH-male in recent diagnosis of HTN (no MD follow up for 10+ years), endstage OA R>L knee who was admitted to [facility] 11/14/16 with right sided weakness and slurred speech.  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  He was started on IV nifedipine and BP medications adjusted for tighter control.  Neurology recommended BP control for management of bleed. 2 D echo with definity revealed mild concentric LVH, mild left atrial dilatation, EF 60-65% and impaired relaxation. Therapy initiated and patient limited by right side weakness with sensory changes, dysarthria and mild dysphagia."  During today's visit, [name] reported experiencing post-stroke cognitive changes in the form of forgetfulness, difficulty retrieving previously learned material, decreased attention, slowed processing speed, and word finding problems. He also suffers right-sided motor issues and sensory loss. No other cognitive, motor or sensory symptoms endorsed.   From an emotional standpoint, [name] acknowledged being quite anxious soon after the stroke, though this has generally abated. He was also not sleeping well but this is better. 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 He does not take medication for mood and does not think that they are  required at this point. He has never participated in counseling. Of late, his major stressor is dealing with work-related issues. Suicidal/homicidal ideation, plan or intent was denied. No manic or hypomanic episodes were reported. The patient denied ever experiencing any auditory/visual hallucinations. No major behavioral or personality changes were endorsed.   [name] was very complimentary of the rehab staff and he feels that he has made good progress in therapy. No major barriers to therapy identified. He has his wife, son, and friends to support him throughout this endeavor. He plans to continue working upon discharge.    PROCEDURES: [2 units 96116]  MENTAL STATUS EXAM: APPEARANCE:  Normal/appropriate GEN:  Alert and oriented MOOD:  Euthymic       AFFECT:  Appropriate  SPEECH:  Clear  THOUGHT CONTENT:  Appropriate HALLUCINATIONS:  None INTELLIGENCE:  Average  INSIGHT:  Good JUDGMENT:  Good   IMPRESSION: Overall, [name] endorsed experiencing post-stroke cognitive changes, as well as anxiety and depression; the latter of which has abated. I recommend outpatient referral to neuropsychology for a comprehensive evaluation of memory and thinking, particularly since he wishes to continue to work. I do not feel that treatment for mood is warranted at this point since his anxiety has generally resolved. I encouraged the patient to call upon neuropsychology if the need arises, though no formal follow-up is warranted at this point.   DIAGNOSES:  CVA Adjustment disorder with anxious mood   Justin MountsAdam T. Divine [name], Psy.D., ABN Board-Certified Clinical Neuropsychologist

## 2016-12-05 NOTE — Progress Notes (Signed)
Recreational Therapy Session Note  Patient Details  Name: Gay FillerStephen Yingst MRN: [id number] Date of Birth: 05/09/1951 Today's Date: 12/05/2016  Pain: no c/o Skilled Therapeutic Interventions/Progress Updates: session focused on community skills at ambulatory level using [facility].  Pt ambulated throughout the hospital gift shop with close supervision/contact guard assist identifying and negotiating obstacles safely.  Marlisha Vanwyk 12/05/2016, 3:30 PM

## 2016-12-05 NOTE — Progress Notes (Signed)
Occupational Therapy Session Note  Patient Details  Name: [name] MRN: [id number] Date of Birth: September 28, 1951  Today's Date: 12/05/2016 OT Individual Time: 1100-1200 OT Individual Time Calculation (min): 60 min     Short Term Goals: Week 2:  OT Short Term Goal 1 (Week 2): Pt will complete shower transfers with RW vs PFRW with supervision OT Short Term Goal 1 - Progress (Week 2): Met OT Short Term Goal 2 (Week 2): Pt will complete toilet transfer with RW vs PFRW with supervision OT Short Term Goal 2 - Progress (Week 2): Met OT Short Term Goal 3 (Week 2): Pt will complete 2 grooming tasks in standing with supervision OT Short Term Goal 3 - Progress (Week 2): Met  Skilled Therapeutic Interventions/Progress Updates:    Pt engaged in BADL retraining including bathing at shower level and dressing with sit<>stand from w/c at sink.  Pt completed all tasks this morning without assistance including pulling up his underwear.  Pt is able to don his socks with use of foot stool.  Pt continues to required an occasional verbal cue for RUE positioning when not engaged functionally.  Continued discharge planning and DME needs.  Recommended BSC and tub transfer bench.  Therapy Documentation Precautions:  Precautions Precautions: Fall Precaution Comments: Absent R sided proprioception, severe OA R knee-don knee support brace Required Braces or Orthoses: Sling (for R UE support when OOB) Restrictions Weight Bearing Restrictions: No   Pain:  Pt denied pain  See Function Navigator for Current Functional Status.   Therapy/Group: Individual Therapy  Leroy Libman 12/05/2016, 12:20 PM

## 2016-12-06 ENCOUNTER — Ambulatory Visit (HOSPITAL_COMMUNITY): Payer: PPO | Admitting: Physical Therapy

## 2016-12-06 ENCOUNTER — Inpatient Hospital Stay (HOSPITAL_COMMUNITY): Payer: PPO | Admitting: Occupational Therapy

## 2016-12-06 ENCOUNTER — Inpatient Hospital Stay (HOSPITAL_COMMUNITY): Payer: PPO

## 2016-12-06 NOTE — Progress Notes (Addendum)
Physical Therapy Discharge Summary  Patient Details  Name: [name] MRN: [id number] Date of Birth: Jun 03, 1951  Today's Date: 12/06/2016 PT Individual Time: 1000-1100 PT Individual Time Calculation (min): 60 min     Patient has met 11 of 11 long term goals due to improved activity tolerance, improved balance, improved postural control, increased strength, decreased pain, ability to compensate for deficits, functional use of  right upper extremity and right lower extremity, improved attention, improved awareness and improved coordination.  Patient to discharge at [facility] w/c level, supervision ambulatory with [facility]  .   Patient's care partner is independent to provide the necessary physical assistance at discharge.   Recommendation:  Patient will benefit from ongoing skilled PT services in outpatient setting to continue to advance safe functional mobility, address ongoing impairments in balance, coordination, motor planning, and awareness, and minimize fall risk.  Equipment: quad cane and w/c  Reasons for discharge: treatment goals met  Patient/family agrees with progress made and goals achieved: Yes   Skilled Therapeutic Intervention: Pt resting in w/c on arrival, no c/o pain and agreeable to therapy session, family present for education during session.  Pt currently performing all mobility at goal level.  Session focus on family education regarding pt's CLOF, hands on education for car transfers, ambulation, and stair negotiation, and f/u with outpatient therapy.  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  Pt and family verbalized understanding of all, pt returned to room at end of session and left with call bell in reach and needs met.   PT Discharge Precautions/RestrictionsPrecautions Precautions: Fall Precaution Comments: decreased R awareness, R ataxia Restrictions Weight  Bearing Restrictions: No Pain Pain Assessment Pain Assessment: No/denies pain Vision/Perception  Vision - Assessment Additional Comments: pt reporting new onset dizziness which appears consistent with motion sensitivity; provided education to pt and wife on habituation exercises for rolling, supine<>sit, and head pitches  Cognition Overall Cognitive Status: Within Functional Limits for tasks assessed Arousal/Alertness: Awake/alert Orientation Level: Oriented X4 Sustained Attention: Appears intact Selective Attention: Appears intact Memory: Appears intact Awareness: Appears intact Sensation Sensation Light Touch: Appears Intact (LEs) Proprioception: Impaired Detail (pt reports he can't tell if his foot is in DF or PF except for where he can feel deep pressure from therapist's hands) Coordination Gross Motor Movements are Fluid and Coordinated: No Fine Motor Movements are Fluid and Coordinated: No Coordination and Movement Description: ongoing ataxia, though improved significantly from eval Motor  Motor Motor - Discharge Observations: R ataxia   Mobility Transfers Transfers: Yes Sit to Stand: 6: Modified independent (Device/Increase time) Stand to Sit: 6: Modified independent (Device/Increase time) Stand Pivot Transfers: 6: Modified independent (Device/Increase time) Locomotion  Ambulation Ambulation: Yes Ambulation/Gait Assistance: 5: Supervision Ambulation Distance (Feet): 150 Feet Assistive device: Small based quad cane Gait Gait: Yes Gait Pattern: Impaired Gait Pattern: Step-through pattern;Decreased step length - left;Decreased weight shift to right Stairs / Additional Locomotion Stairs: Yes Stairs Assistance: 5: Supervision Stair Management Technique: Two rails Number of Stairs: 12 Ramp: 5: Supervision  Trunk/Postural Assessment  Cervical Assessment Cervical Assessment: Within Functional Limits Thoracic Assessment Thoracic Assessment: Within Functional  Limits Lumbar Assessment Lumbar Assessment: Within Functional Limits Postural Control Postural Control: Within Functional Limits  Balance Balance Balance Assessed: Yes Static Standing Balance Static Standing - Balance Support: No upper extremity supported Static Standing - Level of Assistance: 6: Modified independent (Device/Increase time) Dynamic Standing Balance Dynamic Standing - Balance Support: No  upper extremity supported Dynamic Standing - Level of Assistance: 5: Stand by assistance Extremity Assessment      RLE Assessment RLE Assessment: Exceptions to [facility] RLE AROM (degrees) RLE Overall AROM Comments: WFL assessed in sitting RLE Strength RLE Overall Strength: Deficits Right Hip Flexion: 4-/5 Right Knee Flexion: 4/5 Right Knee Extension: 4+/5 Right Ankle Dorsiflexion: 4+/5 Right Ankle Plantar Flexion: 4+/5 RLE Tone RLE Tone: Mild LLE Assessment LLE Assessment: Within Functional Limits LLE Strength LLE Overall Strength Comments: 5/5 throughout   See Function Navigator for Current Functional Status.  [name] 12/06/2016, 12:16 PM

## 2016-12-06 NOTE — Patient Care Conference (Signed)
Inpatient RehabilitationTeam Conference and Plan of Care Update Date: 12/06/2016   Time: 11:15 AM    Patient Name: [name]      Medical Record Number: [id number]  Date of Birth: 02/24/1951 Sex: Male         Room/Bed: 4M08C/4M08C-01 Payor Info: Payor: [employer] / Plan: [employer] / Product Type: *No Product type* /    Admitting Diagnosis: cva  Admit Date/Time:  11/17/2016 11:39 AM Admission Comments: No comment available   Primary Diagnosis:  <principal problem not specified> Principal Problem: <principal problem not specified>  Patient Active Problem List   Diagnosis Date Noted  . Memory loss   . Labile blood pressure   . Morbid obesity (HCC)   . Alteration of sensations, post-stroke   . Hyponatremia   . Chronic right shoulder pain   . HTN (hypertension) 11/23/2016  . Prediabetes 11/23/2016  . Obesity 11/23/2016  . Right shoulder injury 11/23/2016  . Arthropathy of both knees 11/23/2016  . ICH (intracerebral hemorrhage) (HCC) 11/17/2016    Expected Discharge Date: Expected Discharge Date: 12/07/16  Team Members Present: Physician leading conference: Dr. Claudette LawsAndrew Kirsteins Social Worker Present: Dossie DerBecky Zariel Capano, LCSW Nurse Present: Carmie EndAngie Joyce, RN PT Present: Teodoro Kilaitlin Penven-Crew, PT OT Present: Rosalio LoudSarah Hoxie, OT SLP Present: Jackalyn LombardNicole Page, SLP PPS Coordinator present : Tora DuckMarie Noel, RN, CRRN     Current Status/Progress Goal Weekly Team Focus  Medical   Left thalamic hemorrhage, right shoulder pain has improved, still having severe sensory deficits on the right. History morbid obesity  Improve balance to prevent  Prepare patient as well as his wife for discharge   Bowel/Bladder   continent of bowel and bladder  mod I      Swallow/Nutrition/ Hydration             ADL's   supervision - Mod I  Mod I overall, supervision shower transfers  ADL retraining, RUE NMR, family education, d/c planning   Mobility   supervision/mod I  supervision>mod I  grad  day Wednesday, d/c Thursday   Communication             Safety/Cognition/ Behavioral Observations            Pain   painrt leg/ uses voltaren gel          Skin   rashes on rt leg- improving / hydrocortisone cream            *See Care Plan and progress notes for long and short-term goals.  Barriers to Discharge: Left hemisensory deficits    Possible Resolutions to Barriers:  Plan discharge in a.m. after family training completed    Discharge Planning/Teaching Needs:  d/c 1/4  per pt evrything was covered no futher educ needed   Team Discussion:  Progressing toward his goals-mod/i-supervision level. Medically stable for discharge tomorrow. Family here for education today in preparation for discharge. Upgraded diet to regular. Pain in shoulder managed.   Revisions to Treatment Plan:  DC tomorrow   Continued Need for Acute Rehabilitation Level of Care: The patient requires daily medical management by a physician with specialized training in physical medicine and rehabilitation for the following conditions: Daily direction of a multidisciplinary physical rehabilitation program to ensure safe treatment while eliciting the highest outcome that is of practical value to the patient.: Yes Daily medical management of patient stability for increased activity during participation in an intensive rehabilitation regime.: Yes Daily analysis of laboratory values and/or radiology reports with any subsequent need for medication adjustment  of medical intervention for : Neurological problems  Lucy Chris 12/06/2016, 3:33 PM

## 2016-12-06 NOTE — Plan of Care (Signed)
Problem: RH SKIN INTEGRITY Goal: RH STG SKIN FREE OF INFECTION/BREAKDOWN Skin to remain free from infection and breakdown while on rehab with min assist from staff.  Outcome: Progressing Skin rash on rt knee healing ; per pt less itchy

## 2016-12-06 NOTE — Discharge Instructions (Addendum)
Inpatient Rehab Discharge Instructions  Gay FillerStephen Monteverde Discharge date and time: 12/07/16   Activities/Precautions/ Functional Status: Activity: no lifting, driving, or strenuous exercise till cleared by MD.  Diet: cardiac diet and diabetic diet Wound Care: none needed   Functional status:  ___ No restrictions     ___ Walk up steps independently ___ 24/7 supervision/assistance   ___ Walk up steps with assistance _X__ Intermittent supervision/assistance  ___ Bathe/dress independently _X__ Walk with supervision/cane   _X__ Bathe/dress with assistance ___ Walk Independently    ___ Shower independently ___ Walk with assistance    ___ Shower with assistance _X__ No alcohol     ___ Return to work/school ________  Special Instructions: 1. Needs MRI brain repeated in 2 months. 2. Needs BMET rechecked in 7-10 days.     COMMUNITY REFERRALS UPON DISCHARGE:    Outpatient: PT & OT  Agency:[facility] Phone:[phone]   Date of Last Service:12/07/2016  Appointment Date/Time:HAVE CONTACTED WIFE AND SCHEDULED APPOINTMENT FOR 1/9  Medical Equipment/Items Ordered:WHEELCHAIR, SBQC, BEDSIDE COMMODE & TUB BENCH  Agency/Supplier:[facility]  [phone]   GENERAL COMMUNITY RESOURCES FOR PATIENT/FAMILY: Support Groups:[facility] SUPPORT GROUP EVERY SECOND Thursday @ 3:00-4:00PM ON THE REHAB UNIT QUESTIONS CONTACT [place] [phone]  STROKE/TIA DISCHARGE INSTRUCTIONS SMOKING Cigarette smoking nearly doubles your risk of having a stroke & is the single most alterable risk factor  If you smoke or have smoked in the last 12 months, you are advised to quit smoking for your health.  Most of the excess cardiovascular risk related to smoking disappears within a year of stopping.  Ask you doctor about anti-smoking medications  [place] Quit Line: 1-800-QUIT NOW  Free Smoking Cessation Classes [phone]  CHOLESTEROL Know your levels; limit fat & cholesterol in your diet  Lipid  Panel  Chol- 125  HDL: 37   LDL: 72.4     Triglycerides: 78    Many patients benefit from treatment even if their cholesterol is at goal.  Goal: Total Cholesterol (CHOL) less than 160  Goal:  Triglycerides (TRIG) less than 150  Goal:  HDL greater than 40  Goal:  LDL (LDLCALC) less than 100   BLOOD PRESSURE American Stroke Association blood pressure target is less that 120/80 mm/Hg  Your discharge blood pressure is:  BP: 131/75  Monitor your blood pressure  Limit your salt and alcohol intake  Many individuals will require more than one medication for high blood pressure  DIABETES (A1c is a blood sugar average for last 3 months) Goal HGBA1c is under 7% (HBGA1c is blood sugar average for last 3 months)  Diabetes: Pre-diabetic    HGB A1C: 6.3   Your HGBA1c can be lowered with medications, healthy diet, and exercise.  Check your blood sugar as directed by your physician  Call your physician if you experience unexplained or low blood sugars.  PHYSICAL ACTIVITY/REHABILITATION Goal is 30 minutes at least 4 days per week  Activity: No driving, Therapies: see above Return to work: to be decided on follow up.   Activity decreases your risk of heart attack and stroke and makes your heart stronger.  It helps control your weight and blood pressure; helps you relax and can improve your mood.  Participate in a regular exercise program.  Talk with your doctor about the best form of exercise for you (dancing, walking, swimming, cycling).  DIET/WEIGHT Goal is to maintain a healthy weight  Your discharge diet is: Diet Carb Modified Fluid consistency: Thin; Room service appropriate? Yes  liquids  Your height is:  5'7" Your current weight is: Weight: 107.3 kg (236 lb 8.9 oz) Your Body Mass Index (BMI) is:  37  Following the type of diet specifically designed for you will help prevent another stroke.  Your goal weight  is:  159 lbs  Your goal Body Mass Index (BMI) is 19-24.  Healthy  food habits can help reduce 3 risk factors for stroke:  High cholesterol, hypertension, and excess weight.  RESOURCES Stroke/Support Group:  Call [phone]   STROKE EDUCATION PROVIDED/REVIEWED AND GIVEN TO PATIENT Stroke warning signs and symptoms How to activate emergency medical system (call 911). Medications prescribed at discharge. Need for follow-up after discharge. Personal risk factors for stroke. Pneumonia vaccine given:  Flu vaccine given:  My questions have been answered, the writing is legible, and I understand these instructions.  I will adhere to these goals & educational materials that have been provided to me after my discharge from the hospital.      My questions have been answered and I understand these instructions. I will adhere to these goals and the provided educational materials after my discharge from the hospital.  Patient/Caregiver Signature _______________________________ Date __________  Clinician Signature _______________________________________ Date __________  Please bring this form and your medication list with you to all your follow-up doctor's appointments.

## 2016-12-06 NOTE — Progress Notes (Addendum)
Physical Therapy Note  Patient Details  Name: [name] MRN: [id number] Date of Birth: 02/23/1951 Today's Date: 12/06/2016  1300-1330, 30 min individual tx Pain: none  W/c propulsion on unit and in congested home setting modified independent using bil LEs , and LUE.   neuromuscular re-education via forced use for RLE unilateral bridging with R foot on footstool.and R hip abd/adduction with knee ext/flexion as he moved RLE off/on mat, x 10 x 2.  VC for counting aloud to avoid Valsalva. Bed mobility on 26" high mat per bed at home. Discussed safety for supine> sit on R side of bed at home as pt unable to feel edge of bed with R hand; he has considered this and plans to sit up without being very close to edge, and demonstrated this safely. Pt left resting in w/c with all needs within reach.  See function navigator for current status  Judithe Keetch 12/06/2016, 12:59 PM

## 2016-12-06 NOTE — Progress Notes (Signed)
Social Work Patient ID: [name], male   DOB: 03-08-1951, 66 y.o.   MRN: [id number]  Met with pt and wife who is here for family education and all is going well. Has taken wheelchair and bedside commode home awaiting tub seat to be switched Out for the tub bench that was ordered. Wife has been contacted by [facility] for follow up appointment for therapies. Both feel prepared for discharge tomorrow. See in am for any last minute questions.

## 2016-12-06 NOTE — Progress Notes (Signed)
Occupational Therapy Session Note  Patient Details  Name: [name] MRN: [id number] Date of Birth: 01/14/1951  Today's Date: 12/06/2016 OT Individual Time: [id number], [id number], and [id number] OT Individual Time Calculation (min): 61 min, 28 min, and 60 min    Short Term Goals: Week 3:  OT Short Term Goal 1 (Week 3): STG=LTG secondary to ELOS  Skilled Therapeutic Interventions/Progress Updates:    1) Treatment session with focus on family education with self-care tasks and functional transfers.  Pt's wife present for family education.   Engaged in tub/shower and toilet transfers in ADL apt bathroom [facility] and supervision.  Educated on use of tub transfer bench in bathroom and recommendation for grab bar vs none during sit <> stand for bathing.  Recommend supervision for bathroom transfers at this time due to instability.  Completed bed mobility to simulated home height of 25.5" at Mod I level.  Provided pt with theraputty and fine motor control HEP handouts, pt return demonstrated each theraputty exercise with min cues for proper technique.  Encouraged wife to provide cues as needed for proper technique.    2) Treatment session with focus on RUE NMR.  Educated pt and pt's wife on fine motor control HEP with focus on visually attending to task to improve success and coordination.  Pt continues to demonstrate decreased "follow through" when releasing items, often dropping or knocking them over.  Encouraged pt to incorporate familiar, work type tasks into fine motor control with use of nuts, bolts, and washers as well as incorporating board games and cards to address fine and gross motor control as well as incorporating cognitive challenge.  3) Completed ADL retraining at overall Mod I level with exception of supervision with shower and toilet transfers.  Pt utilized tub bench, grab bars, and long handled sponge to increase independence and safety with bathing at sit > stand level in shower.   Recommend obtaining grab bar for home shower to increase safety.  Discussed carryover of training from rehabilitation unit to home with modifications to make self safe and increase independence.  Discussed community reintegration and recommended modification of times to not be out at busiest times of day to allow increased room and decrease room for error.  Pt reports no further questions and thankful for progress made to this point.  Therapy Documentation Precautions:  Precautions Precautions: Fall Precaution Comments: decreased R awareness, R ataxia Required Braces or Orthoses: Sling (for R UE support when OOB) Restrictions Weight Bearing Restrictions: No General:   Vital Signs:   Pain: Pain Assessment Pain Assessment: No/denies pain ADL: ADL ADL Comments: Please see functional navigator for ADL status Exercises:   Other Treatments:    See Function Navigator for Current Functional Status.   Therapy/Group: Individual Therapy  [name], [name] 12/06/2016, 12:20 PM

## 2016-12-06 NOTE — Progress Notes (Signed)
Subjective/Complaints:  Able to stand and urinate Mod I in urinal while holding sink   ROS: neg CP, SOB, N/V/D  Objective: Vital Signs: Blood pressure 131/75, pulse 63, temperature 97.8 F (36.6 C), temperature source Oral, resp. rate 18, weight 107.3 kg (236 lb 8.9 oz), SpO2 96 %. No results found. Results for orders placed or performed during the hospital encounter of 11/17/16 (from the past 72 hour(s))  Basic metabolic panel     Status: Abnormal   Collection Time: 12/05/16 11:02 AM  Result Value Ref Range   Sodium 132 (L) 135 - 145 mmol/L   Potassium 3.5 3.5 - 5.1 mmol/L   Chloride 98 (L) 101 - 111 mmol/L   CO2 26 22 - 32 mmol/L   Glucose, Bld 90 65 - 99 mg/dL   BUN 15 6 - 20 mg/dL   Creatinine, Ser 0.87 0.61 - 1.24 mg/dL   Calcium 9.0 8.9 - 10.3 mg/dL   GFR calc non Af Amer >60 >60 mL/min   GFR calc Af Amer >60 >60 mL/min    Comment: (NOTE) The eGFR has been calculated using the CKD EPI equation. This calculation has not been validated in all clinical situations. eGFR's persistently <60 mL/min signify possible Chronic Kidney Disease.    Anion gap 8 5 - 15  CBC     Status: None   Collection Time: 12/05/16 11:02 AM  Result Value Ref Range   WBC 6.7 4.0 - 10.5 K/uL   RBC 4.66 4.22 - 5.81 MIL/uL   Hemoglobin 14.2 13.0 - 17.0 g/dL   HCT 42.5 39.0 - 52.0 %   MCV 91.2 78.0 - 100.0 fL   MCH 30.5 26.0 - 34.0 pg   MCHC 33.4 30.0 - 36.0 g/dL   RDW 12.3 11.5 - 15.5 %   Platelets 265 150 - 400 K/uL     General: NAD. Vital signs reviewed.  Psych: Mood and affect are appropriate Heart: RRR. No JVD.  Lungs: clear, unlabored Abdomen: Positive bowel sounds, soft  Skin: Intact. Warm and dry. Neurologic:  Motor: B/l UE 5/5 deltoid, bicep, tricep, grip, hip flexor, knee extensors, ankle dorsiflexor and plantar flexor, proximal RUE 4+-5/5--stable Sensory ataxia RUE RUE Dysmetria noted.  Sensation diminished to light touch RUE/RLE Musculoskeletal: No edema. No tenderness.    Assessment/Plan: 1. 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 Physiatrist is providing close team supervision and 24 hour management of active medical problems listed below. Physiatrist and rehab team continue to assess barriers to discharge/monitor patient progress toward functional and medical goals. FIM: Function - Bathing Position: Shower Body parts bathed by patient: Left arm, Right arm, Chest, Abdomen, Front perineal area, Buttocks, Right upper leg, Left upper leg, Right lower leg, Left lower leg, Back Body parts bathed by helper: Back Bathing not applicable: Back Assist Level: Set up  Function- Upper Body Dressing/Undressing What is the patient wearing?: Pull over shirt/dress Pull over shirt/dress - Perfomed by patient: Thread/unthread right sleeve, Thread/unthread left sleeve, Put head through opening, Pull shirt over trunk Assist Level: No help, No cues Function - Lower Body Dressing/Undressing What is the patient wearing?: Underwear, Pants, Socks, Shoes, AFO Position: Wheelchair/chair at sink Underwear - Performed by patient: Thread/unthread right underwear leg, Thread/unthread left underwear leg, Pull underwear up/down Underwear - Performed by helper: Pull underwear up/down Pants- Performed by patient: Thread/unthread right pants leg, Thread/unthread left pants leg, Pull pants  up/down Pants- Performed by helper: Pull pants up/down Socks - Performed by patient: Don/doff right sock, Don/doff left sock Socks - Performed by helper: Don/doff right sock, Don/doff left sock Shoes - Performed by patient: Don/doff right shoe, Don/doff left shoe, Fasten right, Fasten left Shoes - Performed by helper: Don/doff right shoe, Don/doff left shoe, Fasten right, Fasten left AFO - Performed by patient: Don/doff right AFO AFO - Performed by helper:  Don/doff right AFO Assist for footwear: Maximal assist Assist for lower body dressing: Supervision or verbal cues  Function - Toileting Toileting activity did not occur: N/A Toileting steps completed by patient: Adjust clothing prior to toileting, Adjust clothing after toileting, Performs perineal hygiene Toileting steps completed by helper: Adjust clothing prior to toileting, Performs perineal hygiene, Adjust clothing after toileting Toileting Assistive Devices: Grab bar or rail Assist level: Touching or steadying assistance (Pt.75%)  Function - Air cabin crew transfer activity did not occur:  (using urinal, No BM) Toilet transfer assistive device: Elevated toilet seat/BSC over toilet, Grab bar Assist level to toilet: Touching or steadying assistance (Pt > 75%) Assist level from toilet: Touching or steadying assistance (Pt > 75%)  Function - Chair/bed transfer Chair/bed transfer method: Stand pivot Chair/bed transfer assist level: Supervision or verbal cues Chair/bed transfer assistive device: Armrests, Cane, Orthosis Chair/bed transfer details: Verbal cues for technique  Function - Locomotion: Wheelchair Type: Manual Max wheelchair distance: 150 Assist Level: No help, No cues, assistive device, takes more than reasonable amount of time Assist Level: No help, No cues, assistive device, takes more than reasonable amount of time Assist Level: No help, No cues, assistive device, takes more than reasonable amount of time Turns around,maneuvers to table,bed, and toilet,negotiates 3% grade,maneuvers on rugs and over doorsills: Yes Function - Locomotion: Ambulation Assistive device: Cane-quad, Orthosis Max distance: 150 Assist level: Supervision or verbal cues Assist level: Supervision or verbal cues Walk 50 feet with 2 turns activity did not occur: Safety/medical concerns Assist level: Supervision or verbal cues Walk 150 feet activity did not occur: Safety/medical  concerns Assist level: Supervision or verbal cues Walk 10 feet on uneven surfaces activity did not occur: Safety/medical concerns  Function - Comprehension Comprehension: Auditory Comprehension assist level: Follows complex conversation/direction with no assist  Function - Expression Expression: Verbal Expression assist level: Expresses complex ideas: With no assist  Function - Social Interaction Social Interaction assist level: Interacts appropriately with others - No medications needed.  Function - Problem Solving Problem solving assist level: Solves complex problems: Recognizes & self-corrects  Function - Memory Memory assist level: More than reasonable amount of time Patient normally able to recall (first 3 days only): Current season, Location of own room, Staff names and faces, That he or she is in a hospital  Medical Problem List and Plan: 1.  Functional and mobility deficits secondary to left thalamic hemorrhage  Team conference today with D/C planned for tomorrow  MRI demonstrating left thalamic hemorrhage, also involving corona radiata, no additional areas of infarct noted,  Carotid Dopplers, negative, recommending follow-up MRI in ~3 months.F/u with Neuro as Outpt                                           2.  DVT Prophylaxis/Anticoagulation: Pharmaceutical: Lovenox 3. Pain Management:  Will add tramadol prn for knee pain/shoulder pain, has evidence of glenohumeral arthritis but also may have some rotator cuff degeneration  discussed potential for steroid injection. He does not wish to proceed with this at the current time. Trial FES, added Kpad 4. Mood: Team to provide ego support. Under lot of stress due to business commitments/end of year. LCSW to follow for evaluation and support.                                                                                                                                                                  5. Neuropsych: This patient is capable  of making decisions on his own behalf. 6. Skin/Wound Care: Routine pressure relief measures. Maintain adequate nutritional and hydration status.  7. Fluids/Electrolytes/Nutrition: Monitor I/O.  8. HTN: Monitor BP bid--on HCTZ and cozaar.   No longer labile, D/C on current meds Vitals:   12/05/16 1503 12/06/16 0542  BP: 124/63 131/75  Pulse: 71 63  Resp: 18 18  Temp: 98 F (36.7 C) 97.8 F (36.6 C)   9. 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              -consider supportive/OA knee brace for support  11. Prediabetes: Hgb A1c- 6.3. Added CM restrictions.   Consult RD to educate patient on diet.   Will not need to check CBGs 12. Hyponatremia  Na+ 134 on 12/26 (stable)  Controlled 13. Morbid obesity  Body mass index is 37.05 kg/m.  Diet and exercise education  Will cont to encourage weight loss to increase endurance and promote overall health  LOS (Days) 19 A FACE TO FACE EVALUATION WAS PERFORMED  [name] E 12/06/2016, 8:10 AM

## 2016-12-06 NOTE — Progress Notes (Signed)
Occupational Therapy Discharge Summary  Patient Details  Name: [name] MRN: [id number] Date of Birth: 09/24/1951  Patient has met 9 of 10 long term goals due to improved activity tolerance, improved balance, postural control, ability to compensate for deficits, functional use of  RIGHT upper and RIGHT lower extremity, improved attention, improved awareness and improved coordination.  Patient to discharge at overall Modified Independent level with BADLs, supervision with transfers and mobility.  Patient's care partner is independent to provide the necessary physical assistance at discharge.    Reasons goals not met: Pt continues to require supervision for toilet transfers with use of LBQC.  Pt's wife able to provide supervision with mobility.  Recommendation:  Patient will benefit from ongoing skilled OT services in home health setting to continue to advance functional skills in the area of BADL and Reduce care partner burden.  Equipment: BSC and tub transfer bench  Reasons for discharge: treatment goals met and discharge from hospital  Patient/family agrees with progress made and goals achieved: Yes  OT Discharge Precautions/Restrictions  Precautions Precautions: Fall Precaution Comments: decreased R awareness, R ataxia General   Vital Signs Therapy Vitals Temp: 99.1 F (37.3 C) Temp Source: Oral Pulse Rate: 65 Resp: 18 BP: (!) 110/59 Patient Position (if appropriate): Sitting Oxygen Therapy SpO2: 97 % O2 Device: Not Delivered Pain Pain Assessment Pain Assessment: No/denies pain ADL ADL ADL Comments: Please see functional navigator for ADL status Vision/Perception  Vision- History Baseline Vision/History: No visual deficits Patient Visual Report: No change from baseline Vision- Assessment Vision Assessment?: No apparent visual deficits  Cognition Overall Cognitive Status: Within Functional Limits for tasks assessed Arousal/Alertness:  Awake/alert Orientation Level: Oriented X4 Sustained Attention: Appears intact Selective Attention: Appears intact Memory: Appears intact Awareness: Appears intact Problem Solving: Appears intact Safety/Judgment: Appears intact Sensation Sensation Light Touch: Impaired by gross assessment Light Touch Impaired Details: Impaired RUE Stereognosis: Impaired Detail Stereognosis Impaired Details: Impaired RUE Hot/Cold: Not tested Proprioception: Impaired Detail Proprioception Impaired Details: Impaired RUE;Impaired RLE Coordination Gross Motor Movements are Fluid and Coordinated: No Fine Motor Movements are Fluid and Coordinated: No Coordination and Movement Description: ongoing ataxia, though improved significantly from eval Finger Nose Finger Test: overshooting with R UE, improved significantly from eval Mobility  Transfers Sit to Stand: 6: Modified independent (Device/Increase time) Stand to Sit: 6: Modified independent (Device/Increase time)  Extremity/Trunk Assessment RUE Assessment RUE Assessment: Exceptions to [facility] (3+/5) LUE Assessment LUE Assessment: Within Functional Limits   See Function Navigator for Current Functional Status.  Simonne Come 12/06/2016, 4:02 PM

## 2016-12-07 MED ORDER — CARVEDILOL 3.125 MG PO TABS: 3.1250 mg | ORAL_TABLET | Freq: Two times a day (BID) | ORAL | 0 refills | Status: AC

## 2016-12-07 MED ORDER — DICLOFENAC SODIUM 1 % TD GEL: 2.0000 g | Freq: Four times a day (QID) | TRANSDERMAL | 1 refills | Status: AC

## 2016-12-07 MED ORDER — HYDROCHLOROTHIAZIDE 25 MG PO TABS: 25.0000 mg | ORAL_TABLET | Freq: Every day | ORAL | 0 refills | Status: AC

## 2016-12-07 MED ORDER — LOSARTAN POTASSIUM 100 MG PO TABS: 100.0000 mg | ORAL_TABLET | Freq: Every day | ORAL | 0 refills | Status: AC

## 2016-12-07 NOTE — Progress Notes (Signed)
Social Work  Discharge Note  The overall goal for the admission was met for:   Discharge location: Yes-HOME WITH WIFE WHO CAN PROVIDE 24 HR SUPERVISION LEVEL  Length of Stay: Yes-20 DAYS  Discharge activity level: Yes-SUPERVISION-MOD/I LEVEL  Home/community participation: Yes  Services provided included: MD, RD, PT, OT, SLP, RN, CM, TR, Pharmacy, Neuropsych and SW  Financial Services: Private Insurance: [place]  Follow-up services arranged: Outpatient: [facility] FIRST APPOINTMENT 1/9-BEEN IN CONTACT WITH WIFE, DME: [facility], BEDSIDE COMMODE, SBQC & TUB BENCH and Patient/Family has no preference for HH/DME agencies  Comments (or additional information):WIFE WAS HERE YESTERDAY TO GO THROUGH THERAPIES WITH PT AND BOTH COMFORTABLE WITH THE PLAN FOR HOME TODAY. PT TO GO TO OP THERAPIES.  Patient/Family verbalized understanding of follow-up arrangements: Yes  Individual responsible for coordination of the follow-up plan: SELF & JANE-WIFE  Confirmed correct DME delivered: Elease Hashimoto 12/07/2016    Elease Hashimoto

## 2016-12-07 NOTE — Progress Notes (Signed)
Recreational Therapy Discharge Summary Patient Details  Name: Rachel Samples MRN: [id number] Date of Birth: May 01, 1951 Today's Date: 12/07/2016  Long term goals set: 2  Long term goals met: 2  Comments on progress toward goals: Pt has made excellent progress during LOS and is discharging home today at set up/supervision level for TR tasks and supervision/contact guard assist level for community pursuits.  Education provided during eval and treatment session on the importance of stress management & relaxation, activity analysis with potential modifications, home safety/modifications and energy conservation.  Reasons for discharge: discharge from hospital  Patient/family agrees with progress made and goals achieved: Yes  Sahily Biddle 12/07/2016, 8:26 AM

## 2016-12-07 NOTE — Discharge Summary (Signed)
Physician Discharge Summary  Patient ID: [name] MRN: [id number] DOB/AGE: 1951/10/09 66 y.o.  Admit date: 11/17/2016 Discharge date: 12/07/2016  Discharge Diagnoses:  Principal Problem:   ICH (intracerebral hemorrhage) (HCC) Active Problems:   HTN (hypertension)   Prediabetes   Arthropathy of both knees   Hyponatremia   Chronic right shoulder pain   Alteration of sensations, post-stroke   Morbid obesity (HCC)   Discharged Condition:  Stable.   Significant Diagnostic Studies: Dg Shoulder Right  Result Date: 11/22/2016 CLINICAL DATA:  Chronic right shoulder pain. History of osteoarthritis. EXAM: RIGHT SHOULDER - 2+ VIEW COMPARISON:  Portable chest 11/14/2016 FINDINGS: The mineralization and alignment are normal. There is no evidence of acute fracture or dislocation. There are moderate glenohumeral and mild acromioclavicular degenerative changes. The subacromial space appears adequately preserved. Sclerotic lesion in the distal clavicle noted, likely a bone island. IMPRESSION: No acute findings demonstrated. Degenerative changes as described, greatest in the glenohumeral joint. Electronically Signed   By: Carey Bullocks M.D.   On: 11/22/2016 15:37   Mr Brain Wo Contrast  Result Date: 11/18/2016 CLINICAL DATA:  66 y/o  M; intracranial hemorrhage. EXAM: MRI HEAD WITHOUT CONTRAST TECHNIQUE: Multiplanar, multiecho pulse sequences of the brain and surrounding structures were obtained without intravenous contrast. COMPARISON:  Head CT dated 11/15/2016 and 11/14/2016. FINDINGS: Brain: The hematoma centered within the left thalamus extending into left posterior corona radiata is stable in comparison with prior CT given differences in technique. Hematoma is largely T1 heterogeneous and T2 hypointense compatible with acute/ early subacute blood products. There is a small surrounding area of vasogenic edema and local mass effect. There is peripheral diffusion hyperintensity of the hematoma  fat is probably due to susceptibility artifact from the presence of blood products. There is no additional diffusion abnormality to suggest a superimposed acute or early subacute infarction. Few foci of T2 FLAIR hyperintensity in subcortical periventricular white matter compatible mild chronic microvascular ischemic changes. Mild brain parenchymal volume loss. No extra-axial collection. No herniation. No effacement of basilar cisterns. Normal ventricle size Vascular: Normal flow voids. Skull and upper cervical spine: Normal marrow signal. Sinuses/Orbits: Mild maxillary sinus mucosal thickening. No abnormal signal of mastoid air cells. Orbits are unremarkable. Other: Negative. IMPRESSION: 1. Acute/early subacute hemorrhage within the left thalamus extending into left posterior corona radiata and small surrounding a region of vasogenic edema and local mass effect. No additional hemorrhage is identified. 2. No additional focus of diffusion signal abnormality to suggest a superimposed infarct. The hemorrhages is likely hypertensive in etiology given central distribution. 3. Background of mild chronic microvascular ischemic changes and parenchymal volume loss. Electronically Signed   By: Mitzi Hansen M.D.   On: 11/18/2016 00:47    Labs:  Basic Metabolic Panel: BMP Latest Ref Rng & Units 12/05/2016 11/28/2016 11/26/2016  Glucose 65 - 99 mg/dL 90 99 846(N)  BUN 6 - 20 mg/dL 15 15 15   Creatinine 0.61 - 1.24 mg/dL 6.29 5.28 4.13  Sodium 135 - 145 mmol/L 132(L) 134(L) 134(L)  Potassium 3.5 - 5.1 mmol/L 3.5 3.7 3.6  Chloride 101 - 111 mmol/L 98(L) 97(L) 97(L)  CO2 22 - 32 mmol/L 26 29 27   Calcium 8.9 - 10.3 mg/dL 9.0 9.0 8.9    CBC: CBC Latest Ref Rng & Units 12/05/2016 11/28/2016 11/23/2016  WBC 4.0 - 10.5 K/uL 6.7 6.0 6.5  Hemoglobin 13.0 - 17.0 g/dL 24.4 01.0 27.2  Hematocrit 39.0 - 52.0 % 42.5 39.4 41.9  Platelets 150 - 400 K/uL 265 274 306  CBG: No results for input(s): GLUCAP in the  last 168 hours.  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  He was started on IV nifedipine and BP medications adjusted for tighter control.  Neurology recommended BP control for management of bleed. 2 D echo with definity revealed mild concentric LVH, mild left atrial dilatation, EF 60-65% and impaired relaxation. Therapy initiated and patient limited by right side weakness with sensory changes, dysarthria and mild dysphagia. CIR recommended for follow up therapy. Chart reviewed by MD and rehab CM and patient felt to be a good candidate for intensive rehab program   Hospital Course: [name] was admitted to rehab 11/17/2016 for inpatient therapies to consist of PT, ST and OT at least three hours five days a week. Past admission physiatrist, therapy team and rehab RN have worked together to provide customized collaborative inpatient rehab. MRI brain was done for comprehensive work up and showed "acute/early subacute hemorrhage within the left thalamus extending into left posterior corona radiata and small surrounding a region of vasogenic edema and local mass effect". Carotid dopplers were done and showed no significant ICA stenosis. Case was discussed Neuro who recommended repeat MRI brain in 3 months to rule out any other underlying cause of bleed. His blood pressures have been monitored in bid basis and has been overall controlled. He was educated on CM diet and BS were monitored on ac/hs basis initially. This showed good control with BS ranging from 90-115 therefore cbgs was discontinued on 01/02.  Lytes have been monitored with serial checks and hyponatremia continues ranging from 132-134. Potassium levels have been  stable on current dose of  HCTZ. He reported chronic right shoulder pain and X rays done revealing moderate glenohumeral degenerative changes. Local treatment with heat as well as Voltaren gel was added to help with pain management. Mood has been stable and he is continent of bowel and bladder. Right knee brace was used for knee support and pain management of end-stage knee OA. He has made excellent progress during his rehab stay with improvement in He has had improvement in ataxia but continues to have decreased sensation right side with impaired proprioception.  He is modified independent at wheelchair level and will continue to receive follow up outpatient PT and OT at [facility] after discharge.     Rehab course: During patient's stay in rehab weekly team conferences were held to monitor patient's progress, set goals and discuss barriers to discharge. At admission, patient required max assist for mobility and basic self care tasks.  He exhibited mild oral dysphagia with mild dysarthria and reported cognitive slowing post stroke despite normal score on standardized assessment. He has had improvement in activity tolerance, balance, postural control, as well as ability to compensate for deficits. He is has had improvement in functional use RUE  and RLE as well as improved awareness. He is able to complete BADLs at modified independent level and requires supervision for toilet transfers. He is modified independent for transfers and requires supervision to ambulate 150' with [facility].     Disposition: 01-Home or Self Care  Diet: Heart healthy. Diabetic.   Special Instructions: 1. No driving or  strenuous activity. Limit paperwork to couple of hours twice a day. 2. Needs BMET rechecked in 5-7 day 3. Needs MRI brain in 3 months post stroke. s.  Discharge Instructions    Ambulatory referral to Neurology    Complete by:  As directed    An appointment is requested in approximately: 3-4  weeks/stroke follow up.   Ambulatory referral to Physical Medicine Rehab    Complete by:  As directed    1-2 weeks transitional care appt     Allergies as of 12/07/2016   No Known Allergies     Medication List    STOP taking these medications   glucosamine-chondroitin 500-400 MG tablet     TAKE these medications   carvedilol 3.125 MG tablet Commonly known as:  COREG Take 1 tablet (3.125 mg total) by mouth 2 (two) times daily with a meal.   diclofenac sodium 1 % Gel Commonly known as:  VOLTAREN Apply 2 g topically 4 (four) times daily.   hydrochlorothiazide 25 MG tablet Commonly known as:  HYDRODIURIL Take 1 tablet (25 mg total) by mouth daily.   losartan 100 MG tablet Commonly known as:  COZAAR Take 1 tablet (100 mg total) by mouth daily.      Follow-up Information    Maryjean Kahristopher Street, MD Follow up on 12/14/2016.   Specialty:  Family Medicine Why:  APPOINTMENT @ 11:10 AM Contact information: [address] [place] [address] [phone]        Erick ColaceKIRSTEINS,ANDREW E, MD Follow up.   Specialty:  Physical Medicine and Rehabilitation Why:  office will call with follow up appointment Contact information: [address] [place] [address] [phone]        Delia HeadySETHI,PRAMOD, MD Follow up.   Specialties:  Neurology, Radiology Why:  [facility] to call with follow up appointment. Contact information: [address] [place] [address] [phone]           Signed: Jacquelynn CreeLove, Sharika Mosquera S 12/07/2016, 9:44 PM

## 2016-12-07 NOTE — Progress Notes (Signed)
Pt discharged to home with wife. Pt has all belongings at bedside.

## 2016-12-07 NOTE — Progress Notes (Signed)
Subjective/Complaints: No issues overnite   ROS: neg CP, SOB, N/V/D  Objective: Vital Signs: Blood pressure (!) 145/78, pulse 66, temperature 98.7 F (37.1 C), temperature source Oral, resp. rate 18, height _0  (1.702 m), weight 108.5 kg (239 lb 4.8 oz), SpO2 96 %. No results found. Results for orders placed or performed during the hospital encounter of 11/17/16 (from the past 72 hour(s))  Basic metabolic panel     Status: Abnormal   Collection Time: 12/05/16 11:02 AM  Result Value Ref Range   Sodium 132 (L) 135 - 145 mmol/L   Potassium 3.5 3.5 - 5.1 mmol/L   Chloride 98 (L) 101 - 111 mmol/L   CO2 26 22 - 32 mmol/L   Glucose, Bld 90 65 - 99 mg/dL   BUN 15 6 - 20 mg/dL   Creatinine, Ser 0.87 0.61 - 1.24 mg/dL   Calcium 9.0 8.9 - 10.3 mg/dL   GFR calc non Af Amer >60 >60 mL/min   GFR calc Af Amer >60 >60 mL/min    Comment: (NOTE) The eGFR has been calculated using the CKD EPI equation. This calculation has not been validated in all clinical situations. eGFR's persistently <60 mL/min signify possible Chronic Kidney Disease.    Anion gap 8 5 - 15  CBC     Status: None   Collection Time: 12/05/16 11:02 AM  Result Value Ref Range   WBC 6.7 4.0 - 10.5 K/uL   RBC 4.66 4.22 - 5.81 MIL/uL   Hemoglobin 14.2 13.0 - 17.0 g/dL   HCT 42.5 39.0 - 52.0 %   MCV 91.2 78.0 - 100.0 fL   MCH 30.5 26.0 - 34.0 pg   MCHC 33.4 30.0 - 36.0 g/dL   RDW 12.3 11.5 - 15.5 %   Platelets 265 150 - 400 K/uL     General: NAD. Vital signs reviewed.  Psych: Mood and affect are appropriate Heart: RRR. No JVD.  Lungs: clear, unlabored Abdomen: Positive bowel sounds, soft  Skin: Intact. Warm and dry. Neurologic:  Motor: B/l UE 5/5 deltoid, bicep, tricep, grip, hip flexor, knee extensors, ankle dorsiflexor and plantar flexor, proximal RUE 4+-5/5--stable Sensory ataxia RUE RUE Dysmetria noted.  Sensation diminished to light touch RUE/RLE Musculoskeletal: No edema. No tenderness.    Assessment/Plan: 1. 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  Stable for D/C today F/u PCP in 3-4 weeks F/u PM&R 2 weeks See D/C summary See D/C instructions FIM: Function - Bathing Position: Shower Body parts bathed by patient: Left arm, Right arm, Chest, Abdomen, Front perineal area, Buttocks, Right upper leg, Left upper leg, Right lower leg, Left lower leg, Back Body parts bathed by helper: Back Bathing not applicable: Back Assist Level: More than reasonable time, Assistive device Assistive Device Comment: use of long hanlded sponge  Function- Upper Body Dressing/Undressing What is the patient wearing?: Pull over shirt/dress Pull over shirt/dress - Perfomed by patient: Thread/unthread right sleeve, Thread/unthread left sleeve, Put head through opening, Pull shirt over trunk Assist Level: No help, No cues Function - Lower Body Dressing/Undressing What is the patient wearing?: Underwear, Pants, Socks, Shoes, AFO Position: Wheelchair/chair at sink Underwear - Performed by patient: Thread/unthread right underwear leg, Thread/unthread left underwear leg, Pull underwear up/down Underwear - Performed by helper: Pull underwear up/down Pants- Performed by patient: Thread/unthread right pants leg, Thread/unthread left pants leg, Pull pants up/down Pants- Performed by helper: Pull pants up/down Socks - Performed by patient: Don/doff right sock, Don/doff left sock Socks -  Performed by helper: Don/doff right sock, Don/doff left sock Shoes - Performed by patient: Don/doff right shoe, Don/doff left shoe, Fasten right, Fasten left Shoes - Performed by helper: Don/doff right shoe, Don/doff left shoe, Fasten right, Fasten left AFO - Performed by patient: Don/doff right AFO AFO - Performed by helper: Don/doff right AFO Assist for footwear: Independent Assist for lower body dressing: More than reasonable time  Function - Toileting Toileting  activity did not occur: N/A Toileting steps completed by patient: Adjust clothing prior to toileting, Adjust clothing after toileting, Performs perineal hygiene Toileting steps completed by helper: Adjust clothing prior to toileting, Performs perineal hygiene, Adjust clothing after toileting Toileting Assistive Devices: Grab bar or rail Assist level: More than reasonable time  Function - Air cabin crew transfer activity did not occur:  (using urinal, No BM) Toilet transfer assistive device: Grab bar, Cane Assist level to toilet: Supervision or verbal cues Assist level from toilet: Supervision or verbal cues  Function - Chair/bed transfer Chair/bed transfer method: Ambulatory Chair/bed transfer assist level: Supervision or verbal cues Chair/bed transfer assistive device: Armrests, Cane, Orthosis Chair/bed transfer details: Verbal cues for technique  Function - Locomotion: Wheelchair Type: Manual Max wheelchair distance: 150 Assist Level: No help, No cues, assistive device, takes more than reasonable amount of time Assist Level: No help, No cues, assistive device, takes more than reasonable amount of time Assist Level: No help, No cues, assistive device, takes more than reasonable amount of time Turns around,maneuvers to table,bed, and toilet,negotiates 3% grade,maneuvers on rugs and over doorsills: Yes Function - Locomotion: Ambulation Assistive device: Cane-quad, Orthosis Max distance: 6 Assist level: Supervision or verbal cues Assist level: Supervision or verbal cues Walk 50 feet with 2 turns activity did not occur: Safety/medical concerns Assist level: Supervision or verbal cues Walk 150 feet activity did not occur: Safety/medical concerns Assist level: Supervision or verbal cues Walk 10 feet on uneven surfaces activity did not occur: Safety/medical concerns Assist level: Supervision or verbal cues  Function - Comprehension Comprehension: Auditory Comprehension  assist level: Follows complex conversation/direction with no assist  Function - Expression Expression: Verbal Expression assist level: Expresses complex ideas: With no assist  Function - Social Interaction Social Interaction assist level: Interacts appropriately with others - No medications needed.  Function - Problem Solving Problem solving assist level: Solves complex problems: Recognizes & self-corrects  Function - Memory Memory assist level: More than reasonable amount of time Patient normally able to recall (first 3 days only): Current season, Location of own room, Staff names and faces, That he or she is in a hospital  Medical Problem List and Plan: 1.  Functional and mobility deficits secondary to left thalamic hemorrhage  D/C today  MRI demonstrating left thalamic hemorrhage, also involving corona radiata, no additional areas of infarct noted,  Carotid Dopplers, negative, recommending follow-up MRI in ~3 months.F/u with Neuro as Outpt                                           2.  DVT Prophylaxis/Anticoagulation: Pharmaceutical: Lovenox 3. Pain Management:  Will add tramadol prn for knee pain/shoulder pain, has evidence of glenohumeral arthritis but also may have some rotator cuff degeneration discussed potential for steroid injection. He does not wish to proceed with this at the current time. Trial FES, added Kpad 4. Mood: Team to provide ego support. Under lot of stress due to  business commitments/end of year. LCSW to follow for evaluation and support.                                                                                                                                                                  5. Neuropsych: This patient is capable of making decisions on his own behalf. 6. Skin/Wound Care: Routine pressure relief measures. Maintain adequate nutritional and hydration status.  7. Fluids/Electrolytes/Nutrition: Monitor I/O.  8. HTN: Monitor BP bid--on HCTZ and cozaar.    No longer labile, D/C on current meds Vitals:   12/06/16 1542 12/07/16 0537  BP: (!) 110/59 (!) 145/78  Pulse: 65 66  Resp: 18 18  Temp: 99.1 F (37.3 C) 98.7 F (37.1 C)   9. 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              -consider supportive/OA knee brace for support  11. Prediabetes: Hgb A1c- 6.3. Added CM restrictions.   Consult RD to educate patient on diet.   Will not need to check CBGs 12. Hyponatremia  Na+ 134 on 12/26 (stable)  Controlled 13. Morbid obesity  Body mass index is 37.48 kg/m.  Diet and exercise education  Will cont to encourage weight loss to increase endurance and promote overall health  LOS (Days) 20 A FACE TO FACE EVALUATION WAS PERFORMED  KIRSTEINS,ANDREW E 12/07/2016, 7:39 AM

## 2016-12-08 ENCOUNTER — Telehealth: Payer: Self-pay

## 2016-12-08 NOTE — Telephone Encounter (Signed)
Appointment time Confirmed 11:40 am , arrive time 11:20 am with Dr. Allena KatzPatel 1st, then Dr. Wynn BankerKirsteins after Paperwork has been mailed  Transitional Care call- Milon ScoreJane Orourke (wife)  1. Are you/is patient experiencing any problems since coming home? No Are there any questions regarding any aspect of care? No 2. Are there any questions regarding medications administration/dosing"Yes, how many medications does he suppose to have, dosage and how many times a day" Are meds being taken as prescribed? Yes Patient should review meds with caller to confirm  Mrs. Vonita MossBrueilly & I did go over medications and I clarified the dosage, and how many times, he has three medication also a gel. Mrs. Vonita MossBrueilly had it correct just needed clarification 3. Have there been any falls? No 4. Has Home Health been to the house and/or have they contacted you? "Yes, Physical therapy is coming out soon" If not, have you tried to contact them? Can we help you contact them? No 5. Are bowels and bladder emptying properly? Yes Are there any unexpected incontinence issues? No If applicable, is patient following bowel/bladder programs? 6. Any fevers, problems with breathing, unexpected pain? No 7. Are there any skin problems or new areas of breakdown? No 8. Has the patient/family member arranged specialty MD follow up (ie cardiology/neurology/renal/surgical/etc)? Yes  Can we help arrange? No 9. Does the patient need any other services or support that we can help arrange? No 10. Are caregivers following through as expected in assisting the patient? Yes 11. Has the patient quit smoking, drinking alcohol, or using drugs as recommended? "Patient doesn't use any tobacco products, illegal drugs or alcohol"

## 2016-12-11 DIAGNOSIS — I6789 Other cerebrovascular disease: Secondary | ICD-10-CM | POA: Diagnosis not present

## 2016-12-11 DIAGNOSIS — I169 Hypertensive crisis, unspecified: Secondary | ICD-10-CM | POA: Diagnosis not present

## 2016-12-12 DIAGNOSIS — R2689 Other abnormalities of gait and mobility: Secondary | ICD-10-CM | POA: Diagnosis not present

## 2016-12-12 DIAGNOSIS — R2681 Unsteadiness on feet: Secondary | ICD-10-CM | POA: Diagnosis not present

## 2016-12-12 DIAGNOSIS — G8191 Hemiplegia, unspecified affecting right dominant side: Secondary | ICD-10-CM | POA: Diagnosis not present

## 2016-12-12 DIAGNOSIS — R278 Other lack of coordination: Secondary | ICD-10-CM | POA: Diagnosis not present

## 2016-12-12 DIAGNOSIS — R201 Hypoesthesia of skin: Secondary | ICD-10-CM | POA: Diagnosis not present

## 2016-12-12 DIAGNOSIS — M6281 Muscle weakness (generalized): Secondary | ICD-10-CM | POA: Diagnosis not present

## 2016-12-12 DIAGNOSIS — M25511 Pain in right shoulder: Secondary | ICD-10-CM | POA: Diagnosis not present

## 2016-12-12 DIAGNOSIS — R448 Other symptoms and signs involving general sensations and perceptions: Secondary | ICD-10-CM | POA: Diagnosis not present

## 2016-12-12 DIAGNOSIS — I69351 Hemiplegia and hemiparesis following cerebral infarction affecting right dominant side: Secondary | ICD-10-CM | POA: Diagnosis not present

## 2016-12-14 DIAGNOSIS — I10 Essential (primary) hypertension: Secondary | ICD-10-CM | POA: Diagnosis not present

## 2016-12-14 DIAGNOSIS — I6930 Unspecified sequelae of cerebral infarction: Secondary | ICD-10-CM | POA: Diagnosis not present

## 2016-12-14 DIAGNOSIS — I69359 Hemiplegia and hemiparesis following cerebral infarction affecting unspecified side: Secondary | ICD-10-CM | POA: Diagnosis not present

## 2016-12-20 ENCOUNTER — Encounter: Payer: PPO | Admitting: Physical Medicine & Rehabilitation

## 2016-12-22 ENCOUNTER — Ambulatory Visit (HOSPITAL_BASED_OUTPATIENT_CLINIC_OR_DEPARTMENT_OTHER): Payer: PPO | Admitting: Physical Medicine & Rehabilitation

## 2016-12-22 ENCOUNTER — Encounter: Payer: Self-pay | Admitting: Physical Medicine & Rehabilitation

## 2016-12-22 ENCOUNTER — Encounter: Payer: PPO | Attending: Physical Medicine & Rehabilitation

## 2016-12-22 VITALS — BP 132/82 | HR 67 | Resp 14

## 2016-12-22 DIAGNOSIS — R209 Unspecified disturbances of skin sensation: Secondary | ICD-10-CM

## 2016-12-22 DIAGNOSIS — Z833 Family history of diabetes mellitus: Secondary | ICD-10-CM | POA: Insufficient documentation

## 2016-12-22 DIAGNOSIS — M19011 Primary osteoarthritis, right shoulder: Secondary | ICD-10-CM | POA: Diagnosis not present

## 2016-12-22 DIAGNOSIS — I10 Essential (primary) hypertension: Secondary | ICD-10-CM | POA: Diagnosis not present

## 2016-12-22 DIAGNOSIS — M170 Bilateral primary osteoarthritis of knee: Secondary | ICD-10-CM | POA: Diagnosis not present

## 2016-12-22 DIAGNOSIS — I69398 Other sequelae of cerebral infarction: Secondary | ICD-10-CM | POA: Diagnosis not present

## 2016-12-22 DIAGNOSIS — Z820 Family history of epilepsy and other diseases of the nervous system: Secondary | ICD-10-CM | POA: Diagnosis not present

## 2016-12-22 DIAGNOSIS — I610 Nontraumatic intracerebral hemorrhage in hemisphere, subcortical: Secondary | ICD-10-CM | POA: Diagnosis not present

## 2016-12-22 NOTE — Progress Notes (Signed)
Subjective:    Patient ID: [name], male    DOB: 05/16/1951, 66 y.o.   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  He was started on IV nifedipine and BP medications adjusted for tighter control.  Neurology recommended BP control for management of bleed. 2 D echo with definity revealed mild concentric LVH, mild left atrial dilatation, EF 60-65% and impaired relaxation. Therapy initiated and patient limited by right side weakness with sensory changes, dysarthria and mild dysphagia.  HPI [facility] Rehab Admit date: 11/17/2016 Discharge date: 12/07/2016   MRI brain was done for comprehensive work up and showed "acute/early subacute hemorrhage within the left thalamus extending into left posterior corona radiata and small surrounding a region of vasogenic edema and local mass effect". Carotid dopplers were done and showed no significant ICA stenosis. Case was discussed Neuro who recommended repeat MRI brain in 3 months to rule out any other underlying cause of bleed He reported chronic right shoulder pain and X rays done revealing moderate glenohumeral degenerative changes. Local treatment with heat as well as Voltaren gel was added to help with pain management. Mood has been stable and he is continent of bowel and bladder. Right knee brace was used for knee support and pain management of end-stage knee OA.  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 General activity 5 Relation with others 0 Enjoyment of life 5 What TIME of day is your pain at its worst? evening Sleep (in general) Good  Pain is worse with: some activites Pain improves with: medication Relief from Meds: 5  Mobility walk without assistance how many minutes can you walk? 4-5 ability to climb steps?  yes use a wheelchair transfers alone Do you have any goals in this area?  no  Function employed # of hrs/week . I need assistance with the following:  bathing, meal prep and shopping Do you have any goals in this area?  yes  Neuro/Psych weakness numbness tingling dizziness  Prior Studies Any changes since last visit?  no  Physicians involved in your care Any changes since last visit?  no   Family History  Problem Relation Age of Onset  . Dementia Mother   . Diabetes Father    Social History   Social History  . Marital status: Married    Spouse name: N/A  . Number of children: N/A  . Years of education: N/A   Social History Main Topics  . Smoking status: Never Smoker  . Smokeless tobacco: Never Used  . Alcohol use No     Comment: History of alcohol abuse--none for 8 years  . Drug use: No  . Sexual activity: Not Asked   Other Topics Concern  . None   Social History Narrative  . None   Past Surgical History:  Procedure Laterality Date  . TONSILLECTOMY     Past Medical History:  Diagnosis Date  .  HTN (hypertension)   . OA (osteoarthritis) of knee    endstage OA bilateral knees--was recently cleared for surgery  . Right leg swelling   . Right shoulder injury 2015   BP 132/82 (BP Location: Left Arm, Patient Position: Sitting, Cuff Size: Large)   Pulse 67   Resp 14   SpO2 98%   Opioid Risk Score:   Fall Risk Score:  `1  Depression screen PHQ 2/9  No flowsheet data found.  Review of Systems  HENT: Negative.   Eyes: Negative.   Respiratory: Negative.   Cardiovascular: Negative.   Gastrointestinal: Negative.    Endocrine: Negative.   Genitourinary: Negative.   Musculoskeletal: Positive for gait problem.  Allergic/Immunologic: Negative.   Neurological: Positive for dizziness, weakness and numbness.       Tingling  Hematological: Negative.   Psychiatric/Behavioral: Negative.   All other systems reviewed and are negative.      Objective:   Physical Exam  Constitutional: He appears well-developed and well-nourished.  HENT:  Head: Normocephalic and atraumatic.  Eyes: Conjunctivae and EOM are normal. Pupils are equal, round, and reactive to light.  Musculoskeletal:  No pain with shoulder range of motion, negative impingement signs, right and left shoulder  Neurological: He displays no atrophy. He exhibits normal muscle tone.  Skin: Skin is warm and dry.    Motor strength is 4/5 in the right deltoid, biceps, triceps, grip, 4 plus at the right hip flexor, knee extensor, ankle dorsiflexor 5/5 left deltoid, biceps, triceps, grip, hip flexor, knee extensor, ankle dorsiflexor, plantar flexor. Sensation is reduced to proprioception in the right hand. Intact on the left hand. Mildly reduced pinprick sensation right hand compared to the left hand. Markedly diminished light touch sensation right hand versus left hand. Intact light touch in the right lower limb. Ambulation not tested. Did not have assistive device with.       Assessment & Plan:  1. 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 Showing some signs of improvement in pinprick and temperature sensation. However, proprioception, light touch, remains severely affected. Recommend continued outpatient PT, OT. Recommend follow-up with neurology in 1-2 months. Will need repeat MRI in 2 months, neurology can order this. PCP follow-up for secondary stroke prevention in a addition to above PMR follow-up 6 weeks

## 2016-12-22 NOTE — Patient Instructions (Signed)
[facility], Dr. Marlis EdelsonSethi's office should be calling you in the next week or so to schedule an appointment for about 2 months

## 2017-01-03 ENCOUNTER — Encounter: Payer: Self-pay | Admitting: Neurology

## 2017-01-03 ENCOUNTER — Ambulatory Visit (INDEPENDENT_AMBULATORY_CARE_PROVIDER_SITE_OTHER): Payer: PPO | Admitting: Neurology

## 2017-01-03 VITALS — BP 103/64 | HR 71 | Ht 67.0 in | Wt 240.8 lb

## 2017-01-03 DIAGNOSIS — I610 Nontraumatic intracerebral hemorrhage in hemisphere, subcortical: Secondary | ICD-10-CM | POA: Diagnosis not present

## 2017-01-03 NOTE — Progress Notes (Signed)
[facility] [address] [place]. [place] [address] [phone]       OFFICE CONSULT NOTE  [name] Date of Birth:  08/09/1951 Medical Record Number:  [id number]   Referring MD:  Claudette Laws Reason for Referral:  ICH  HPI: Mr [name] is a pleasant 66 year old Caucasian male seen today for consultation following admission to [facility] on 11/14/16 with intracerebral hemorrhage. History is obtained from patient and referral notes. I personally reviewed imaging studies at [facility] but hospital discharge summary is not available at this time. He presented on 11/14/16 with sudden onset of right hand weakness and slurred speech. He was taken to [facility] where his blood pressure was found to be significantly elevated and he was given IV blood pressure medications for tighter control. CT scan of the head showed a 2.2 x 1.4 mm acute left thalamic hemorrhage with minimum edema but no intraventricular extension or hydrocephalus. Telemetry neurology was consulted and patient was admitted to the intensive care unit and follow-up CT scan showed stable appearance of the hemorrhage. Transthoracic echo and carotid ultrasound were apparently unremarkable. Patient was seen by therapy and was transferred to inpatient rehabilitation and [facility]. Patient's blood pressure monitored and kept under tight control. Patient is presently getting outpatient therapy. He states he still has some weakness and incoordination and sensory loss in his right hand mainly. His blood pressure is doing better now. Patient had previously not seen him primary medical doctor for 10 years and had uncontrolled hypertension which was undetected and untreated. Today his blood pressure is 103/64 in our office. He has stopped any added salt in his diet and has started to eat healthy and plans to lose weight. He has a repeat MRI scan of the brain scheduled on March 2.  ROS:   14 system  review of systems is positive for  hearing loss, joint pain, cramps, aching muscles, weakness, decreased energy, decreased coordination and sensation in all other systems negative  PMH:  Past Medical History:  Diagnosis Date  . HTN (hypertension)   . OA (osteoarthritis) of knee    endstage OA bilateral knees--was recently cleared for surgery  . Right leg swelling   . Right shoulder injury 2015  . Stroke [facility])     Social History:  Social History   Social History  . Marital status: Married    Spouse name: N/A  . Number of children: N/A  . Years of education: N/A   Occupational History  . Not on file.   Social History Main Topics  . Smoking status: Never Smoker  . Smokeless tobacco: Never Used  . Alcohol use No     Comment: History of alcohol abuse--none for 8 years  . Drug use: No  . Sexual activity: Not on file   Other Topics Concern  . Not on file   Social History Narrative  . No narrative on file    Medications:   Current Outpatient Prescriptions on File Prior to Visit  Medication Sig Dispense Refill  . carvedilol (COREG) 3.125 MG tablet Take 1 tablet (3.125 mg total) by mouth 2 (two) times daily with a meal. 60 tablet 0  . diclofenac sodium (VOLTAREN) 1 % GEL Apply 2 g topically 4 (four) times daily. 4 Tube 1  . hydrochlorothiazide (HYDRODIURIL) 25 MG tablet Take 1 tablet (25 mg total) by mouth daily. 30 tablet 0  . losartan (COZAAR) 100 MG tablet Take 1 tablet (100 mg total) by mouth daily. 30  tablet 0   No current facility-administered medications on file prior to visit.     Allergies:  No Known Allergies  Physical Exam General: well developed, well nourished Middle-age Caucasian male, seated, in no evident distress Head: head normocephalic and atraumatic.   Neck: supple with no carotid or supraclavicular bruits Cardiovascular: regular rate and rhythm, no murmurs Musculoskeletal: no deformity Skin:  no rash/petichiae Vascular:  Normal pulses all  extremities  Neurologic Exam Mental Status: Awake and fully alert. Oriented to place and time. Recent and remote memory intact. Attention span, concentration and fund of knowledge appropriate. Mood and affect appropriate.  Cranial Nerves: Fundoscopic exam reveals sharp disc margins. Pupils equal, briskly reactive to light. Extraocular movements full without nystagmus. Visual fields full to confrontation. Hearing intact. Facial sensation intact. Face, tongue, palate moves normally and symmetrically.  Motor: Normal bulk and tone. Normal strength in all tested extremity muscles. Mild weakness of right grip and intrinsic hand muscles. Diminished fine finger movements on the right. Orbits left over right approximately. Sensory.: diminished touch , pinprick , position and vibratory sensation.on the right side.  Coordination: Rapid alternating movements normal in all extremities. Finger-to-nose and heel-to-shin performed accurately bilaterally. Gait and Station: Arises from chair without difficulty. Stance is normal. Gait demonstrates normal stride length and balance but drags right leg while walking. . Able to heel, toe and tandem walk with moderate  difficulty.  Reflexes: 1+ and symmetric. Toes downgoing.   NIHSS  2 Modified Rankin 3  ASSESSMENT: [age] year Caucasian male with left thalamic intracerebral hemorrhage secondary to hypertension in December 2017 with persistent mild right hand sensory and proprioceptive impairment.    PLAN: I had a long d/w patient and his wife about his recent hemorrhagic stroke, risk for recurrent stroke/TIAs, personally independently reviewed imaging studies and stroke evaluation results and answered questions.Continue   strict control of hypertension with blood pressure goal below 130/90, diabetes with hemoglobin A1c goal below 6.5% and lipids with LDL cholesterol goal below 70 mg/dL. I also advised the patient to eat a healthy diet with plenty of whole grains, cereals,  fruits and vegetables, exercise regularly and maintain ideal body weight. Follow repeat MRI brain already scheduled for 02/02/17.Marland Kitchen. He was advised to continue ongoing outpatient occupational and speech therapy. Greater than 50% time during this 45 minute consultation visit was spent on counseling and coordination of care about his uncontrolled hypertension, intracerebral hemorrhage, risk for recurrence and answering questions Followup in the future with my nurse practitioner in 6 months or call earlier if necessary Delia HeadyPramod Donesha Wallander, MD  [facility] [address] [place], [place] [id number]  Phone [phone] Fax [phone] Note: This document was prepared with digital dictation and possible smart phrase technology. Any transcriptional errors that result from this process are unintentional.

## 2017-01-03 NOTE — Patient Instructions (Signed)
I had a long d/w patient and his wife about his recent hemorrhagic stroke, risk for recurrent stroke/TIAs, personally independently reviewed imaging studies and stroke evaluation results and answered questions.Continue   strict control of hypertension with blood pressure goal below 130/90, diabetes with hemoglobin A1c goal below 6.5% and lipids with LDL cholesterol goal below 70 mg/dL. I also advised the patient to eat a healthy diet with plenty of whole grains, cereals, fruits and vegetables, exercise regularly and maintain ideal body weight. Follow repeat MRI brain already scheduled for 02/02/17.[name] He was advised to continue ongoing outpatient occupational and speech therapy Followup in the future with my nurse practitioner in 6 months or call earlier if necessary  Intracerebral Hemorrhage Introduction An intracerebral hemorrhage occurs when a blood vessel in the brain leaks or bursts. Areas of the brain that should receive blood, oxygen, and nutrients from the damaged blood vessel are deprived of blood flow. This causes areas of the brain to become damaged. Damage also occurs to areas of the brain where the leaked blood accumulates and presses on normal tissue. This is a medical emergency. This can cause permanent damage and loss of brain function. Early and aggressive treatment leads to better recovery. What are the causes?  Head injury (trauma).  A ballooning of a weak section in a blood vessel (aneurysm).  Hardened, thin blood vessels. Blood vessel walls lined with plaque become thin and hardened. These hardened, thin artery walls can crack open and allow blood to flow out of the blood vessel.  An abnormal formation of a blood vessel (arteriovenous malformation). This condition results in an abnormal tangle of thin-walled blood vessels.  Protein buildup on the artery walls of the brain (amyloid angiopathy). Sometimes the cause of an intracerebral hemorrhage is not known. What increases the  risk?  High blood pressure (hypertension).  Male gender.  Advancing age.  Moderate or heavy use of alcohol.  Taking blood-thinning medicines (anticoagulants). What are the signs or symptoms?  Sudden, severe headache with no known cause. The headache is often described as the worst headache ever experienced.  Sudden confusion.  Sudden trouble speaking (aphasia) or understanding.  Seizures.  Sudden nausea and vomiting.  Hypertension.  Sudden weakness or numbness of the face, arm, or leg, especially on one side of the body.  Sudden trouble seeing in one or both eyes.  Sudden trouble walking or difficulty moving arms or legs.  Sudden dizziness.  Sudden loss of balance or coordination. How is this diagnosed? An intracerebral hemorrhage is diagnosed with:  A history and physical exam.  Imaging tests to view the brain. These may include a CT scan or MRI.  Imaging tests to view vessels in the brain. These may include computed tomography angiography (CTA) or a magnetic resonance angiogram (MRA). How is this treated? An intracerebral hemorrhage requires emergency treatment. The goals of treatment are to stop the bleeding, control pressure in the brain, and relieve symptoms. Treatment may include:  Medicines to manage:  Blood pressure.  Pain.  Nausea or vomiting.  Seizures.  Fever.  Other medicines, blood products, or vitamin K may also be given to control the bleeding.  A tube (shunt) in the brain to relieve pressure.  Assisted breathing (ventilation).  Surgery to stop bleeding, remove a blood clot or tumor, and reduce pressure. Surgeries to do this include:  Craniotomy. This is a surgery to remove part of the skull to reduce pressure.  Stereotactic aspiration. During this procedure, a needle or syringe is inserted into  the brain to remove blood. Follow these instructions at home:  Take medicines only as directed by your health care provider.  Eat healthy  foods as directed by your health care provider:  A diet low in salt (sodium), saturated fat, trans fat, and cholesterol may be recommended to manage your blood pressure.  Foods may need to be soft or pureed, or small bites may need to be taken in order to avoid aspirating or choking.  If studies show that your ability to swallow safely has been affected, you may need to seek help from specialists such as a dietitian, speech and language pathologist, or an occupational therapist. These health care providers can teach you how to safely get the nutrition your body needs.  Follow physical activity guidelines as directed by your health care team.  Avoid heavy alcohol use.  Manage any other health care conditions you may have, if applicable.  A safe home environment is important to reduce the risk of falls. Your health care provider may arrange for specialists to evaluate your home. Having grab bars in the bedroom and bathroom is often important. Your health care provider may arrange for special equipment to be used at home, such as raised toilets and a seat for the shower.  Do physical, occupational, and speech therapy as directed by your health care provider. Ongoing therapy may be needed to maximize your recovery.  Use a walker or a cane at all times if directed by your health care provider  Keep all follow-up visits as directed by your health care provider. This is important. This includes any referrals, physical therapy, rehabilitation, and laboratory tests. Get help right away if:  You have a sudden, severe headache with no known cause.  You have sudden:  Confusion.  Trouble speaking (aphasia) or understanding speech.  Weakness or numbness of the face, arm, or leg, especially on one side of the body.  Trouble seeing in one or both eyes.  Trouble walking or difficulty moving arms or legs.  Dizziness.  You have a sudden loss of balance or coordination.  You have a  seizure.  You have a partial or total loss of consciousness. These symptoms may represent a serious problem that is an emergency. Do not wait to see if the symptoms will go away. Get medical help right away. Call your local emergency services (911 in the [place] Do not drive yourself to the hospital.  This information is not intended to replace advice given to you by your health care provider. Make sure you discuss any questions you have with your health care provider. Document Released: 06/17/2014 Document Revised: 04/27/2016 Document Reviewed: 02/06/2014  2017 [employer]

## 2017-01-04 DIAGNOSIS — I69351 Hemiplegia and hemiparesis following cerebral infarction affecting right dominant side: Secondary | ICD-10-CM | POA: Diagnosis not present

## 2017-01-04 DIAGNOSIS — R448 Other symptoms and signs involving general sensations and perceptions: Secondary | ICD-10-CM | POA: Diagnosis not present

## 2017-01-04 DIAGNOSIS — M25511 Pain in right shoulder: Secondary | ICD-10-CM | POA: Diagnosis not present

## 2017-01-04 DIAGNOSIS — R278 Other lack of coordination: Secondary | ICD-10-CM | POA: Diagnosis not present

## 2017-01-11 DIAGNOSIS — I169 Hypertensive crisis, unspecified: Secondary | ICD-10-CM | POA: Diagnosis not present

## 2017-01-11 DIAGNOSIS — I6789 Other cerebrovascular disease: Secondary | ICD-10-CM | POA: Diagnosis not present

## 2017-01-29 ENCOUNTER — Ambulatory Visit (HOSPITAL_BASED_OUTPATIENT_CLINIC_OR_DEPARTMENT_OTHER): Payer: PPO | Admitting: Physical Medicine & Rehabilitation

## 2017-01-29 ENCOUNTER — Encounter: Payer: PPO | Attending: Physical Medicine & Rehabilitation

## 2017-01-29 ENCOUNTER — Encounter: Payer: Self-pay | Admitting: Physical Medicine & Rehabilitation

## 2017-01-29 VITALS — BP 128/84 | HR 67

## 2017-01-29 DIAGNOSIS — Z833 Family history of diabetes mellitus: Secondary | ICD-10-CM | POA: Diagnosis not present

## 2017-01-29 DIAGNOSIS — I69398 Other sequelae of cerebral infarction: Secondary | ICD-10-CM | POA: Diagnosis not present

## 2017-01-29 DIAGNOSIS — Z820 Family history of epilepsy and other diseases of the nervous system: Secondary | ICD-10-CM | POA: Insufficient documentation

## 2017-01-29 DIAGNOSIS — M19011 Primary osteoarthritis, right shoulder: Secondary | ICD-10-CM | POA: Insufficient documentation

## 2017-01-29 DIAGNOSIS — M170 Bilateral primary osteoarthritis of knee: Secondary | ICD-10-CM | POA: Diagnosis not present

## 2017-01-29 DIAGNOSIS — R209 Unspecified disturbances of skin sensation: Secondary | ICD-10-CM | POA: Diagnosis not present

## 2017-01-29 DIAGNOSIS — I610 Nontraumatic intracerebral hemorrhage in hemisphere, subcortical: Secondary | ICD-10-CM | POA: Diagnosis not present

## 2017-01-29 DIAGNOSIS — I10 Essential (primary) hypertension: Secondary | ICD-10-CM | POA: Insufficient documentation

## 2017-01-29 NOTE — Progress Notes (Signed)
Subjective:    Patient ID: [name], male    DOB: 01/26/1951, 66 y.o.   MRN: [id number]  HPI No longer uses Knee orthosis due to skin irritation Doing a lot of walking Working ~35-40hrs per week No falls Dizziness improved denies visual issues Sensation reduced Right side, tingling  RIght foot  Dressing and bathing Mod I   Pain Inventory Average Pain 3 Pain Right Now 2 My pain is dull and .  In the last 24 hours, has pain interfered with the following? General activity 2 Relation with others 2 Enjoyment of life 2 What TIME of day is your pain at its worst? night Sleep (in general) .  Pain is worse with: walking and inactivity Pain improves with: rest and medication Relief from Meds: 5  Mobility use a cane ability to climb steps?  yes do you drive?  no  Function employed # of hrs/week 35 I need assistance with the following:  meal prep and shopping  Neuro/Psych weakness numbness tingling anxiety  Prior Studies Any changes since last visit?  no  Physicians involved in your care Any changes since last visit?  no   Family History  Problem Relation Age of Onset  . Dementia Mother   . Diabetes Father    Social History   Social History  . Marital status: Married    Spouse name: N/A  . Number of children: N/A  . Years of education: N/A   Social History Main Topics  . Smoking status: Never Smoker  . Smokeless tobacco: Never Used  . Alcohol use No     Comment: History of alcohol abuse--none for 8 years  . Drug use: No  . Sexual activity: Not on file   Other Topics Concern  . Not on file   Social History Narrative  . No narrative on file   Past Surgical History:  Procedure Laterality Date  . TONSILLECTOMY     Past Medical History:  Diagnosis Date  . HTN (hypertension)   . OA (osteoarthritis) of knee    endstage OA bilateral knees--was recently cleared for surgery  . Right leg swelling   . Right shoulder injury 2015  . Stroke  [facility](HCC)    There were no vitals taken for this visit.  Opioid Risk Score:   Fall Risk Score:  `1  Depression screen PHQ 2/9  Depression screen PHQ 2/9 12/22/2016  Decreased Interest 0  Down, Depressed, Hopeless 0  PHQ - 2 Score 0  Altered sleeping 0  Tired, decreased energy 1  Change in appetite 0  Feeling bad or failure about yourself  0  Trouble concentrating 0  Moving slowly or fidgety/restless 0  Suicidal thoughts 0  PHQ-9 Score 1  Difficult doing work/chores Somewhat difficult    Review of Systems  HENT: Negative.   Eyes: Negative.   Respiratory: Negative.   Cardiovascular: Negative.   Gastrointestinal: Negative.   Endocrine: Negative.   Genitourinary: Negative.   Musculoskeletal: Negative.   Skin: Negative.   Allergic/Immunologic: Negative.   Neurological: Positive for weakness and numbness.  Hematological: Negative.   Psychiatric/Behavioral: The patient is nervous/anxious.   All other systems reviewed and are negative.      Objective:   Physical Exam  Constitutional: He is oriented to person, place, and time. He appears well-developed and well-nourished.  HENT:  Head: Normocephalic and atraumatic.  Eyes: Conjunctivae and EOM are normal. Pupils are equal, round, and reactive to light.  Neck: Normal range of motion.  Neurological:  He is alert and oriented to person, place, and time.  Patient with absent proprioception right hand. Intact light touch. Intact proprioception right foot. Intact light touch, right foot. Right upper extremity 4 minus in the deltoid, biceps, triceps, grip Mild to moderate ataxia on finger-nose-finger. Right lower extremity 4/5 and hip flexor, knee extensor, ankle dorsiflexor. There is decreased speed of movement with rapid alternating ankle dorsiflexion, plantarflexion.  Psychiatric: He has a normal mood and affect.  Nursing note and vitals reviewed.         Assessment & Plan:  1. 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 Sensation improving right lower extremity still is poor right upper extremity. Also has associated ataxia, right lower extremity. Overall making good progress, 2.5 months out from event. Recommend no driving. Recommend recheck in one month. Continue outpatient therapy  We discussed return to driving at some length what needs to be done as well as compensatory techniques that may be needed Over half of the 25 min visit was spent counseling and coordinating care.

## 2017-01-29 NOTE — Patient Instructions (Addendum)
Your right foot. Sensation has improved. Please work on coordination such as foot tapping. No driving until further notice

## 2017-01-31 DIAGNOSIS — R7301 Impaired fasting glucose: Secondary | ICD-10-CM | POA: Diagnosis not present

## 2017-01-31 DIAGNOSIS — F419 Anxiety disorder, unspecified: Secondary | ICD-10-CM | POA: Diagnosis not present

## 2017-01-31 DIAGNOSIS — I10 Essential (primary) hypertension: Secondary | ICD-10-CM | POA: Diagnosis not present

## 2017-01-31 DIAGNOSIS — I69359 Hemiplegia and hemiparesis following cerebral infarction affecting unspecified side: Secondary | ICD-10-CM | POA: Diagnosis not present

## 2017-01-31 DIAGNOSIS — I6930 Unspecified sequelae of cerebral infarction: Secondary | ICD-10-CM | POA: Diagnosis not present

## 2017-02-01 DIAGNOSIS — R448 Other symptoms and signs involving general sensations and perceptions: Secondary | ICD-10-CM | POA: Diagnosis not present

## 2017-02-01 DIAGNOSIS — M25511 Pain in right shoulder: Secondary | ICD-10-CM | POA: Diagnosis not present

## 2017-02-01 DIAGNOSIS — I69351 Hemiplegia and hemiparesis following cerebral infarction affecting right dominant side: Secondary | ICD-10-CM | POA: Diagnosis not present

## 2017-02-01 DIAGNOSIS — R278 Other lack of coordination: Secondary | ICD-10-CM | POA: Diagnosis not present

## 2017-02-02 ENCOUNTER — Ambulatory Visit: Payer: PPO | Admitting: Physical Medicine & Rehabilitation

## 2017-02-02 DIAGNOSIS — I169 Hypertensive crisis, unspecified: Secondary | ICD-10-CM | POA: Diagnosis not present

## 2017-02-02 DIAGNOSIS — I6789 Other cerebrovascular disease: Secondary | ICD-10-CM | POA: Diagnosis not present

## 2017-02-19 ENCOUNTER — Other Ambulatory Visit: Payer: Self-pay | Admitting: Neurology

## 2017-02-19 ENCOUNTER — Telehealth: Payer: Self-pay | Admitting: Neurology

## 2017-02-19 DIAGNOSIS — I610 Nontraumatic intracerebral hemorrhage in hemisphere, subcortical: Secondary | ICD-10-CM

## 2017-02-19 NOTE — Telephone Encounter (Signed)
Dr.Sethi pt was seen in January 2018, suppose to have a MRI.There is no order. Please advise me and Irving Burtonmily.

## 2017-02-19 NOTE — Telephone Encounter (Signed)
Ok I will order ibuprofen beleive I was told it was scheduled for 02/02/17 but did not happen.

## 2017-02-19 NOTE — Telephone Encounter (Signed)
Pt's wife called said she has been waiting to receive a call to schedule MRI. In OV notes from Dr Kathie RhodesS 01/03/17, provider states MRI is already scheduled for 3/2, she was not aware of this. Please call.

## 2017-02-19 NOTE — Telephone Encounter (Signed)
Rn spoke with patients wife about a schedule MRi. Rn stated pt never had a MRI schedule on 02/02/2017. RN also stated it was never order in January 2018 by Dr. Pearlean BrownieSethi. Patients wife stated when pt was in the hospital they wanted another MRI in 3 months to check for the bleed in her husband head. Rn stated if the MRi is order does she [place] [address]. PTs wife stated wherever Dr. Pearlean BrownieSethi wants her husband to have it. Rn explain to wife Dr. Pearlean BrownieSethi is working in the hospital this week. Pts wife requested a call back when its order.

## 2017-02-20 NOTE — Telephone Encounter (Signed)
Per [employer] insurance health team advantage I faxed clinical notes to get the authorization but it takes up to 14 business days to get the authorization. This is how his insurance does the authorization process. I did send the order to [facility].. They can call at anytime to schedule the MRI at [phone]

## 2017-02-20 NOTE — Telephone Encounter (Signed)
Rn call patients wife Justin SquibbJane about the order being put in for MRI at [facility]. Rn stated per [name] the MRI was sent to GI. Rn also stated that per [name] with her husbands insurance it will take up to 14 days for authorization. Rn gave wife the number of [phone] for [facility] to schedule if it gets approve or authorized. Justin SquibbJane verbalized understanding.

## 2017-02-20 NOTE — Telephone Encounter (Signed)
MRi order by Dr. Pearlean BrownieSethi.

## 2017-02-26 ENCOUNTER — Ambulatory Visit: Payer: PPO | Admitting: Physical Medicine & Rehabilitation

## 2017-03-05 ENCOUNTER — Other Ambulatory Visit: Payer: PPO

## 2017-03-05 DIAGNOSIS — I169 Hypertensive crisis, unspecified: Secondary | ICD-10-CM | POA: Diagnosis not present

## 2017-03-05 DIAGNOSIS — I6789 Other cerebrovascular disease: Secondary | ICD-10-CM | POA: Diagnosis not present

## 2017-03-06 DIAGNOSIS — M25511 Pain in right shoulder: Secondary | ICD-10-CM | POA: Diagnosis not present

## 2017-03-06 DIAGNOSIS — I69351 Hemiplegia and hemiparesis following cerebral infarction affecting right dominant side: Secondary | ICD-10-CM | POA: Diagnosis not present

## 2017-03-06 DIAGNOSIS — R278 Other lack of coordination: Secondary | ICD-10-CM | POA: Diagnosis not present

## 2017-03-06 DIAGNOSIS — R448 Other symptoms and signs involving general sensations and perceptions: Secondary | ICD-10-CM | POA: Diagnosis not present

## 2017-03-08 ENCOUNTER — Ambulatory Visit: Payer: PPO | Admitting: Physical Medicine & Rehabilitation

## 2017-03-08 DIAGNOSIS — Z833 Family history of diabetes mellitus: Secondary | ICD-10-CM | POA: Insufficient documentation

## 2017-03-08 DIAGNOSIS — I10 Essential (primary) hypertension: Secondary | ICD-10-CM | POA: Insufficient documentation

## 2017-03-08 DIAGNOSIS — I69398 Other sequelae of cerebral infarction: Secondary | ICD-10-CM | POA: Insufficient documentation

## 2017-03-08 DIAGNOSIS — M19011 Primary osteoarthritis, right shoulder: Secondary | ICD-10-CM | POA: Insufficient documentation

## 2017-03-08 DIAGNOSIS — R209 Unspecified disturbances of skin sensation: Secondary | ICD-10-CM | POA: Insufficient documentation

## 2017-03-08 DIAGNOSIS — Z820 Family history of epilepsy and other diseases of the nervous system: Secondary | ICD-10-CM | POA: Insufficient documentation

## 2017-03-08 DIAGNOSIS — M170 Bilateral primary osteoarthritis of knee: Secondary | ICD-10-CM | POA: Insufficient documentation

## 2017-03-08 DIAGNOSIS — I610 Nontraumatic intracerebral hemorrhage in hemisphere, subcortical: Secondary | ICD-10-CM | POA: Insufficient documentation

## 2017-03-12 ENCOUNTER — Ambulatory Visit
Admission: RE | Admit: 2017-03-12 | Discharge: 2017-03-12 | Disposition: A | Discharge status: Home / Self Care | Payer: PPO | Source: Ambulatory Visit | Attending: Neurology | Admitting: Neurology

## 2017-03-12 DIAGNOSIS — I610 Nontraumatic intracerebral hemorrhage in hemisphere, subcortical: Secondary | ICD-10-CM

## 2017-03-12 DIAGNOSIS — I619 Nontraumatic intracerebral hemorrhage, unspecified: Secondary | ICD-10-CM | POA: Diagnosis not present

## 2017-03-12 MED ORDER — GADOBENATE DIMEGLUMINE 529 MG/ML IV SOLN
20.0000 mL | Freq: Once | INTRAVENOUS | Status: AC | PRN
  Administered 2017-03-12: 20 mL via INTRAVENOUS

## 2017-03-15 ENCOUNTER — Telehealth: Payer: Self-pay

## 2017-03-15 NOTE — Telephone Encounter (Signed)
-----   Message from Micki Riley, MD sent at 03/14/2017  8:43 PM EDT ----- Joneen Roach inform patient that MRI scan of brain shows expected improvement changes in left sided brain hemorrhage without any new or worrisome findings

## 2017-03-15 NOTE — Telephone Encounter (Signed)
Rn call patient about his MRi brain. PT was at home. Rn spoke with his wife who is on DPR form.Rn stated it showed expected improvement changes in the left sided brain hemorrhage without any worrisome findings. PTs wife verbalized understanding. She will let her husband know of the results.

## 2017-03-20 ENCOUNTER — Ambulatory Visit (HOSPITAL_BASED_OUTPATIENT_CLINIC_OR_DEPARTMENT_OTHER): Payer: PPO | Admitting: Physical Medicine & Rehabilitation

## 2017-03-20 ENCOUNTER — Encounter: Payer: PPO | Attending: Physical Medicine & Rehabilitation

## 2017-03-20 ENCOUNTER — Encounter: Payer: Self-pay | Admitting: Physical Medicine & Rehabilitation

## 2017-03-20 VITALS — BP 136/77 | HR 71 | Resp 14

## 2017-03-20 DIAGNOSIS — M19011 Primary osteoarthritis, right shoulder: Secondary | ICD-10-CM | POA: Diagnosis not present

## 2017-03-20 DIAGNOSIS — R209 Unspecified disturbances of skin sensation: Secondary | ICD-10-CM

## 2017-03-20 DIAGNOSIS — I69398 Other sequelae of cerebral infarction: Secondary | ICD-10-CM

## 2017-03-20 DIAGNOSIS — M170 Bilateral primary osteoarthritis of knee: Secondary | ICD-10-CM | POA: Diagnosis not present

## 2017-03-20 DIAGNOSIS — I610 Nontraumatic intracerebral hemorrhage in hemisphere, subcortical: Secondary | ICD-10-CM | POA: Diagnosis not present

## 2017-03-20 DIAGNOSIS — I10 Essential (primary) hypertension: Secondary | ICD-10-CM | POA: Diagnosis not present

## 2017-03-20 DIAGNOSIS — I69319 Unspecified symptoms and signs involving cognitive functions following cerebral infarction: Secondary | ICD-10-CM

## 2017-03-20 DIAGNOSIS — Z833 Family history of diabetes mellitus: Secondary | ICD-10-CM | POA: Diagnosis not present

## 2017-03-20 DIAGNOSIS — Z820 Family history of epilepsy and other diseases of the nervous system: Secondary | ICD-10-CM | POA: Diagnosis not present

## 2017-03-20 NOTE — Progress Notes (Signed)
Subjective:    Patient ID: [name], male    DOB: 1951/03/05, 66 y.o.   MRN: [id number]  HPI Using Left hand to write, still a lot of numbness in right hand more in index or thumb  Mild wording deficits also problem with multi tasking  Increasing anxiety per wife but not depressed, has dificulty with concentrating  Still working full time self employed  Practicing driving on his private property  Practicing moving head while walking  Had repeat MRI brain to follow-up on left thalamic hemorrhage. There is no underlying brain mass identified.  Pain Inventory Average Pain 4 Pain Right Now 2 My pain is tingling and aching  In the last 24 hours, has pain interfered with the following? General activity 3 Relation with others 0 Enjoyment of life 0 What TIME of day is your pain at its worst? evening Sleep (in general) Fair  Pain is worse with: some activites Pain improves with: medication Relief from Meds: 7  Mobility walk without assistance ability to climb steps?  yes do you drive?  yes Do you have any goals in this area?  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  yes  Neuro/Psych weakness numbness tingling  Prior Studies Any changes since last visit?  no  Physicians involved in your care Any changes since last visit?  no   Family History  Problem Relation Age of Onset  . Dementia Mother   . Diabetes Father    Social History   Social History  . Marital status: Married    Spouse name: N/A  . Number of children: N/A  . Years of education: N/A   Social History Main Topics  . Smoking status: Never Smoker  . Smokeless tobacco: Never Used  . Alcohol use No     Comment: History of alcohol abuse--none for 8 years  . Drug use: No  . Sexual activity: Not Asked   Other Topics Concern  . None   Social History Narrative  . None   Past Surgical History:  Procedure  Laterality Date  . TONSILLECTOMY     Past Medical History:  Diagnosis Date  . HTN (hypertension)   . OA (osteoarthritis) of knee    endstage OA bilateral knees--was recently cleared for surgery  . Right leg swelling   . Right shoulder injury 2015  . Stroke (HCC)    BP 136/77   Pulse 71   Resp 14   SpO2 95%   Opioid Risk Score:   Fall Risk Score:  `1  Depression screen PHQ 2/9  Depression screen PHQ 2/9 12/22/2016  Decreased Interest 0  Down, Depressed, Hopeless 0  PHQ - 2 Score 0  Altered sleeping 0  Tired, decreased energy 1  Change in appetite 0  Feeling bad or failure about yourself  0  Trouble concentrating 0  Moving slowly or fidgety/restless 0  Suicidal thoughts 0  PHQ-9 Score 1  Difficult doing work/chores Somewhat difficult    Review of Systems  HENT: Negative.   Respiratory: Negative.   Cardiovascular: Negative.   Gastrointestinal: Negative.   Endocrine: Negative.   Genitourinary: Negative.   Musculoskeletal: Negative.   Skin: Negative.   Allergic/Immunologic: Negative.   Neurological: Positive for weakness and numbness.       Tingling  Psychiatric/Behavioral: Negative.   All other systems reviewed and are negative.      Objective:   Physical  Exam  Constitutional: He is oriented to person, place, and time.  Neurological: He is alert and oriented to person, place, and time. He displays no tremor. A sensory deficit is present. He exhibits normal muscle tone. Gait normal.  Patient has intact light touch and pinprick in the right upper extremity. He has absent proprioception at the fingers of the right hand. Patient has dysmetria, right finger-nose-finger No evidence of dysmetria, left finger-nose-finger Romberg is negative.   Psychiatric: His behavior is normal. His affect is blunt. His speech is delayed.  Vitals reviewed.         Assessment & Plan:  1.. Left thalamic Intracranial hemorrhage causing residual right hemisensory deficits,  mainly proprioception. 2. 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 He is functioning at a high level, running the business but is noticing some difficulty with attention and concentration. His wife also feels that he is having some problems with adjustment  Over half of the 25 min visit was spent counseling and coordinating care.

## 2017-03-20 NOTE — Patient Instructions (Signed)
Please drives only on your  private property unil next visit

## 2017-04-03 DIAGNOSIS — R448 Other symptoms and signs involving general sensations and perceptions: Secondary | ICD-10-CM | POA: Diagnosis not present

## 2017-04-03 DIAGNOSIS — R278 Other lack of coordination: Secondary | ICD-10-CM | POA: Diagnosis not present

## 2017-04-03 DIAGNOSIS — I69351 Hemiplegia and hemiparesis following cerebral infarction affecting right dominant side: Secondary | ICD-10-CM | POA: Diagnosis not present

## 2017-04-03 DIAGNOSIS — M25511 Pain in right shoulder: Secondary | ICD-10-CM | POA: Diagnosis not present

## 2017-04-04 DIAGNOSIS — I169 Hypertensive crisis, unspecified: Secondary | ICD-10-CM | POA: Diagnosis not present

## 2017-04-04 DIAGNOSIS — I6789 Other cerebrovascular disease: Secondary | ICD-10-CM | POA: Diagnosis not present

## 2017-05-05 DIAGNOSIS — I6789 Other cerebrovascular disease: Secondary | ICD-10-CM | POA: Diagnosis not present

## 2017-05-05 DIAGNOSIS — I169 Hypertensive crisis, unspecified: Secondary | ICD-10-CM | POA: Diagnosis not present

## 2017-05-21 ENCOUNTER — Ambulatory Visit: Payer: PPO | Admitting: Physical Medicine & Rehabilitation

## 2017-05-21 ENCOUNTER — Encounter: Payer: PPO | Attending: Physical Medicine & Rehabilitation

## 2017-05-21 DIAGNOSIS — I610 Nontraumatic intracerebral hemorrhage in hemisphere, subcortical: Secondary | ICD-10-CM | POA: Insufficient documentation

## 2017-05-21 DIAGNOSIS — I69398 Other sequelae of cerebral infarction: Secondary | ICD-10-CM | POA: Insufficient documentation

## 2017-05-21 DIAGNOSIS — M170 Bilateral primary osteoarthritis of knee: Secondary | ICD-10-CM | POA: Insufficient documentation

## 2017-05-21 DIAGNOSIS — I10 Essential (primary) hypertension: Secondary | ICD-10-CM | POA: Insufficient documentation

## 2017-05-21 DIAGNOSIS — M19011 Primary osteoarthritis, right shoulder: Secondary | ICD-10-CM | POA: Insufficient documentation

## 2017-05-21 DIAGNOSIS — R209 Unspecified disturbances of skin sensation: Secondary | ICD-10-CM | POA: Insufficient documentation

## 2017-05-21 DIAGNOSIS — Z833 Family history of diabetes mellitus: Secondary | ICD-10-CM | POA: Insufficient documentation

## 2017-05-21 DIAGNOSIS — Z820 Family history of epilepsy and other diseases of the nervous system: Secondary | ICD-10-CM | POA: Insufficient documentation

## 2017-06-04 DIAGNOSIS — I169 Hypertensive crisis, unspecified: Secondary | ICD-10-CM | POA: Diagnosis not present

## 2017-06-04 DIAGNOSIS — I6789 Other cerebrovascular disease: Secondary | ICD-10-CM | POA: Diagnosis not present

## 2017-07-05 ENCOUNTER — Ambulatory Visit: Payer: PPO | Admitting: Nurse Practitioner

## 2017-07-05 DIAGNOSIS — I6789 Other cerebrovascular disease: Secondary | ICD-10-CM | POA: Diagnosis not present

## 2017-07-05 DIAGNOSIS — I169 Hypertensive crisis, unspecified: Secondary | ICD-10-CM | POA: Diagnosis not present

## 2017-08-01 NOTE — Progress Notes (Signed)
[facility]  PATIENT: [name] DOB: 28-May-1951   REASON FOR VISIT: follow up stroke HISTORY FROM: patient and wife    HISTORY OF PRESENT ILLNESS:UPDATE 08/30/2018CM [name], 66 year old male returns for follow-up after admission for left thalamic hemorrhage in December 2017. Repeat MRI of the brain 4/ 9 /2018 shows sequela of a hemorrhagic stroke in the left lateral thalamus. When compared to  MRI of December 2017  expected evolution of the hemorrhage that was acute on that scan. Minimal age-appropriate chronic microvascular ischemic change. He still has some weakness in  the right hand.He continues to see [name]. All therapies have concluded Blood pressure in the office today 119/78. He is trying to be more active and exercise. Wife reports that he snores and he has been told in the past he needs a sleep study. He returns for reevaluation 01/03/17 [name] is a pleasant 66 year old Caucasian male seen today for consultation following admission to [facility] on 11/14/16 with intracerebral hemorrhage. History is obtained from patient and referral notes. I personally reviewed imaging studies at [facility] but hospital discharge summary is not available at this time. He presented on 11/14/16 with sudden onset of right hand weakness and slurred speech. He was taken to [facility] where his blood pressure was found to be significantly elevated and he was given IV blood pressure medications for tighter control. CT scan of the head showed a 2.2 x 1.4 mm acute left thalamic hemorrhage with minimum edema but no intraventricular extension or hydrocephalus. Telemetry neurology was consulted and patient was admitted to the intensive care unit and follow-up CT scan showed stable appearance of the hemorrhage. Transthoracic echo and carotid ultrasound were apparently unremarkable. Patient was seen by therapy and was transferred to inpatient  rehabilitation and [facility]. Patient's blood pressure monitored and kept under tight control. Patient is presently getting outpatient therapy. He states he still has some weakness and incoordination and sensory loss in his right hand mainly. His blood pressure is doing better now. Patient had previously not seen him primary medical doctor for 10 years and had uncontrolled hypertension which was undetected and untreated. Today his blood pressure is 103/64 in our office. He has stopped any added salt in his diet and has started to eat healthy and plans to lose weight. He has a repeat MRI scan of the brain scheduled on March 2.   REVIEW OF SYSTEMS: Full 14 system review of systems performed and notable only for those listed, all others are neg:  Constitutional: neg  Cardiovascular: neg Ear/Nose/Throat: neg  Skin: neg Eyes: neg Respiratory: neg Gastroitestinal: neg  Hematology/Lymphatic: neg  Endocrine: neg Musculoskeletal: Right hand weakness Allergy/Immunology: neg Neurological: neg Psychiatric: Anxiety Sleep : neg   ALLERGIES: No Known Allergies  HOME MEDICATIONS: Outpatient Medications Prior to Visit  Medication Sig Dispense Refill  . acetaminophen (TYLENOL) 325 MG tablet Take 650 mg by mouth every 6 (six) hours as needed.    . carvedilol (COREG) 3.125 MG tablet Take 1 tablet (3.125 mg total) by mouth 2 (two) times daily with a meal. 60 tablet 0  . diclofenac sodium (VOLTAREN) 1 % GEL Apply 2 g topically 4 (four) times daily. 4 Tube 1  . hydrochlorothiazide (HYDRODIURIL) 25 MG tablet Take 1 tablet (25 mg total) by mouth daily. 30 tablet 0  . losartan (COZAAR) 100 MG tablet Take 1 tablet (100 mg total) by mouth daily. 30 tablet 0  . losartan (COZAAR) 50 MG tablet  No facility-administered medications prior to visit.     PAST MEDICAL HISTORY: Past Medical History:  Diagnosis Date  . HTN (hypertension)   . OA (osteoarthritis) of knee    endstage OA bilateral knees--was  recently cleared for surgery  . Right leg swelling   . Right shoulder injury 2015  . Stroke [facility])     PAST SURGICAL HISTORY: Past Surgical History:  Procedure Laterality Date  . TONSILLECTOMY      FAMILY HISTORY: Family History  Problem Relation Age of Onset  . Dementia Mother   . Diabetes Father     SOCIAL HISTORY: Social History   Social History  . Marital status: Married    Spouse name: N/A  . Number of children: N/A  . Years of education: N/A   Occupational History  . Not on file.   Social History Main Topics  . Smoking status: Never Smoker  . Smokeless tobacco: Never Used  . Alcohol use No     Comment: History of alcohol abuse--none for 8 years  . Drug use: No  . Sexual activity: Not on file   Other Topics Concern  . Not on file   Social History Narrative  . No narrative on file     PHYSICAL EXAM  Vitals:   08/02/17 1449  BP: 119/78  Pulse: 67  Weight: 247 lb 9.6 oz (112.3 kg)  Height: 5\' 7"  (1.702 m)   Body mass index is 38.78 kg/m.  Generalized: Well developed, in no acute distress  Head: normocephalic and atraumatic,. Oropharynx benign  Neck: Supple, no carotid bruits  Cardiac: Regular rate rhythm, no murmur  Musculoskeletal: No deformity   Neurological examination   Mentation: Alert oriented to time, place, history taking. Attention span and concentration appropriate. Recent and remote memory intact.  Follows all commands speech and language fluent.   Cranial nerve II-XII: Pupils were equal round reactive to light extraocular movements were full, visual field were full on confrontational test. Facial sensation and strength were normal. hearing was intact to finger rubbing bilaterally. Uvula tongue midline. head turning and shoulder shrug were normal and symmetric.Tongue protrusion into cheek strength was normal. Motor: Mild weakness of the right grip and intrinsic hand muscles with diminished fine finger movements on the right. Sensory:  Diminished touch and pinprick and position and vibratory sensation on the right side normal on the left   Coordination: finger-nose-finger, heel-to-shin bilaterally, no dysmetria Reflexes: 1+ upper lower and symmetric plantar responses were flexor bilaterally. Gait and Station: Rising up from seated position without assistance, normal stance,  moderate stride, good arm swing, smooth turning, able to perform tiptoe, and heel walking without difficulty. Tandem gait is unsteady  DIAGNOSTIC DATA (LABS, IMAGING, TESTING) - I reviewed patient records, labs, notes, testing and imaging myself where available.  Lab Results  Component Value Date   WBC 6.7 12/05/2016   HGB 14.2 12/05/2016   HCT 42.5 12/05/2016   MCV 91.2 12/05/2016   PLT 265 12/05/2016      Component Value Date/Time   NA 132 (L) 12/05/2016 1102   K 3.5 12/05/2016 1102   CL 98 (L) 12/05/2016 1102   CO2 26 12/05/2016 1102   GLUCOSE 90 12/05/2016 1102   BUN 15 12/05/2016 1102   CREATININE 0.87 12/05/2016 1102   CALCIUM 9.0 12/05/2016 1102   PROT 6.5 11/18/2016 0412   ALBUMIN 3.4 (L) 11/18/2016 0412   AST 25 11/18/2016 0412   ALT 34 11/18/2016 0412   ALKPHOS 44 11/18/2016 0412  BILITOT 0.8 11/18/2016 0412   GFRNONAA >60 12/05/2016 1102   GFRAA >60 12/05/2016 1102    ASSESSMENT AND PLAN   [age] year Caucasian male with left thalamic intracerebral hemorrhage secondary to hypertension in December 2017 with persistent mild right hand sensory and proprioceptive impairment.    PLAN: Stressed the importance of management of risk factors to prevent further stroke MRI of the brain 03/12/17 with expected evolution of the hemorrhage that was acute on that scan.    There are no new strokes.Begin aspirin .81mg  Maintain strict control of hypertension with blood pressure goal below 130/90, today's reading 119/78 continue antihypertensive medications Control of diabetes with hemoglobin A1c below 6.5 followed by primary care  Cholesterol with LDL cholesterol less than 70, followed by primary care,  Exercise by walking, slowly increase , eat healthy diet with whole grains,  fresh fruits and vegetables Will set up for sleep study Follow up in 6 months I spent 25 min  in total face to face time with the patient more than 50% of which was spent counseling and coordination of care, reviewing test results reviewing medications and discussing and reviewing the diagnosis of stroke and management of risk factors. Also discussed importance of sleep study given patient's medical history Nilda RiggsNancy Carolyn Ladene Allocca, [facility], [facility], APRN  [facility] [address], [address] [place], [place] [address] [phone]

## 2017-08-02 ENCOUNTER — Ambulatory Visit (INDEPENDENT_AMBULATORY_CARE_PROVIDER_SITE_OTHER): Payer: PPO | Admitting: Nurse Practitioner

## 2017-08-02 ENCOUNTER — Encounter: Payer: Self-pay | Admitting: Nurse Practitioner

## 2017-08-02 VITALS — BP 119/78 | HR 67 | Ht 67.0 in | Wt 247.6 lb

## 2017-08-02 DIAGNOSIS — I610 Nontraumatic intracerebral hemorrhage in hemisphere, subcortical: Secondary | ICD-10-CM

## 2017-08-02 DIAGNOSIS — R209 Unspecified disturbances of skin sensation: Secondary | ICD-10-CM | POA: Diagnosis not present

## 2017-08-02 DIAGNOSIS — I10 Essential (primary) hypertension: Secondary | ICD-10-CM

## 2017-08-02 DIAGNOSIS — R0683 Snoring: Secondary | ICD-10-CM | POA: Diagnosis not present

## 2017-08-02 DIAGNOSIS — I69398 Other sequelae of cerebral infarction: Secondary | ICD-10-CM | POA: Diagnosis not present

## 2017-08-02 NOTE — Patient Instructions (Signed)
Stressed the importance of management of risk factors to prevent further stroke MRI of the brain 03/12/17 with expected evolution of the hemorrhage that was acute on that scan.    There are no new strokes.Begin aspirin .81mg  Maintain strict control of hypertension with blood pressure goal below 130/90, today's reading 119/78 continue antihypertensive medications Control of diabetes with hemoglobin A1c below 6.5 followed by primary care Cholesterol with LDL cholesterol less than 70, followed by primary care,  Exercise by walking, slowly increase , eat healthy diet with whole grains,  fresh fruits and vegetables Will set up for sleep study Follow up in 6 months

## 2017-08-05 DIAGNOSIS — I169 Hypertensive crisis, unspecified: Secondary | ICD-10-CM | POA: Diagnosis not present

## 2017-08-05 DIAGNOSIS — I6789 Other cerebrovascular disease: Secondary | ICD-10-CM | POA: Diagnosis not present

## 2017-08-09 NOTE — Progress Notes (Signed)
I agree with the above plan 

## 2017-09-04 DIAGNOSIS — I6789 Other cerebrovascular disease: Secondary | ICD-10-CM | POA: Diagnosis not present

## 2017-09-04 DIAGNOSIS — I169 Hypertensive crisis, unspecified: Secondary | ICD-10-CM | POA: Diagnosis not present

## 2017-09-19 ENCOUNTER — Institutional Professional Consult (permissible substitution): Payer: PPO | Admitting: Neurology

## 2017-09-26 DIAGNOSIS — I69359 Hemiplegia and hemiparesis following cerebral infarction affecting unspecified side: Secondary | ICD-10-CM | POA: Diagnosis not present

## 2017-09-26 DIAGNOSIS — Z6839 Body mass index (BMI) 39.0-39.9, adult: Secondary | ICD-10-CM | POA: Diagnosis not present

## 2017-09-26 DIAGNOSIS — I10 Essential (primary) hypertension: Secondary | ICD-10-CM | POA: Diagnosis not present

## 2017-09-26 DIAGNOSIS — Z23 Encounter for immunization: Secondary | ICD-10-CM | POA: Diagnosis not present

## 2017-09-26 DIAGNOSIS — M170 Bilateral primary osteoarthritis of knee: Secondary | ICD-10-CM | POA: Diagnosis not present

## 2017-09-26 DIAGNOSIS — I6930 Unspecified sequelae of cerebral infarction: Secondary | ICD-10-CM | POA: Diagnosis not present

## 2017-09-28 IMAGING — MR MR HEAD W/O CM
9 of 10 series · 35 of 48 positions shown · non-contrast
Comparison: Head CT dated 11/15/2016 and 11/14/2016.

CLINICAL DATA: 65 y/o  M; intracranial hemorrhage.

EXAM:
MRI HEAD WITHOUT CONTRAST
TECHNIQUE: Multiplanar, multiecho pulse sequences of the brain and surrounding
structures were obtained without intravenous contrast.

[Series 2: FLAIR · sagittal · 5.0mm · 0.47mm/px · 2 of 23 slices shown (1 of 2)]
[im 1/23]
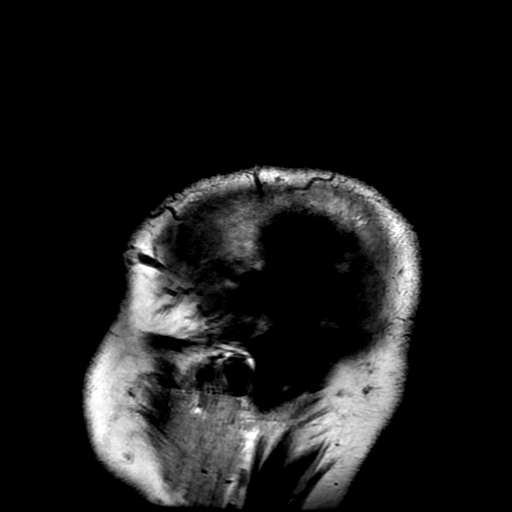
[im 23/23]
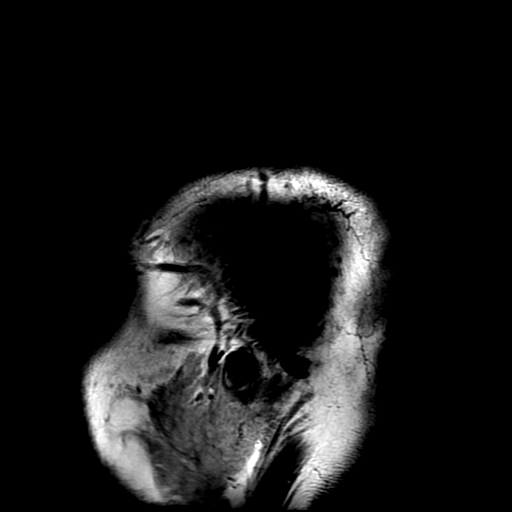

[Series 4: DWI · axial · 3.0mm · 0.94mm/px · z∈[-98,+53]mm · 9 of 103 slices shown (1 of 2)]
[im 1/103]
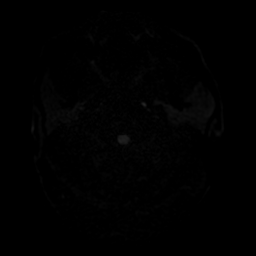
[im 13/103]
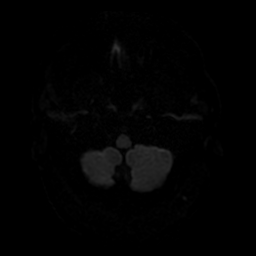
[im 26/103]
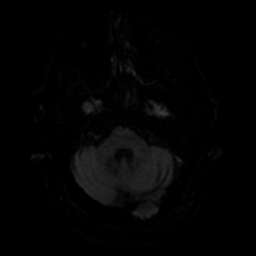
[im 39/103]
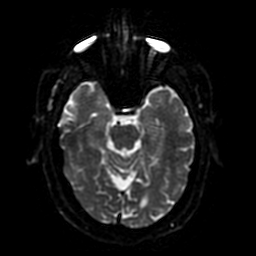
[im 52/103]
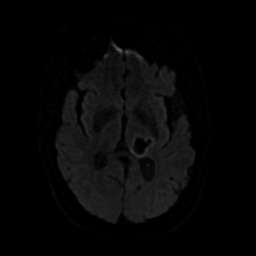
[im 64/103]
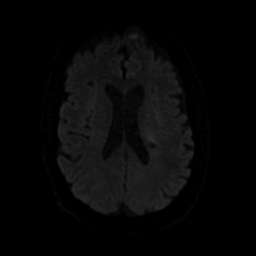
[im 77/103]
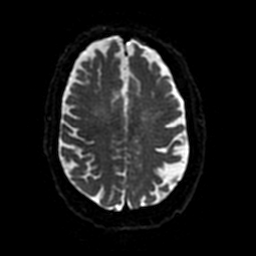
[im 90/103]
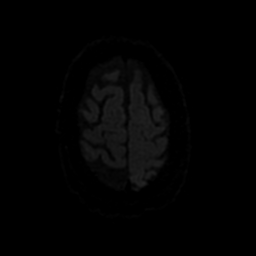
[im 103/103]
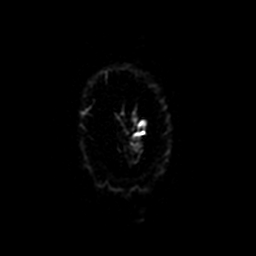

[Series 5: T2 · axial · 5.0mm · 0.47mm/px · z∈[-96,+52]mm · 2 of 26 slices shown (1 of 2)]
[im 1/26]
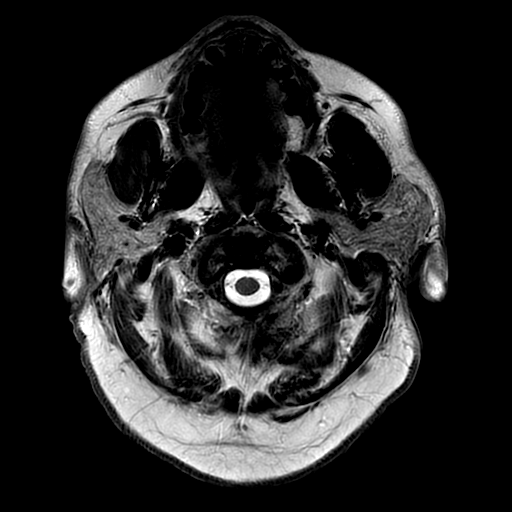
[im 26/26]
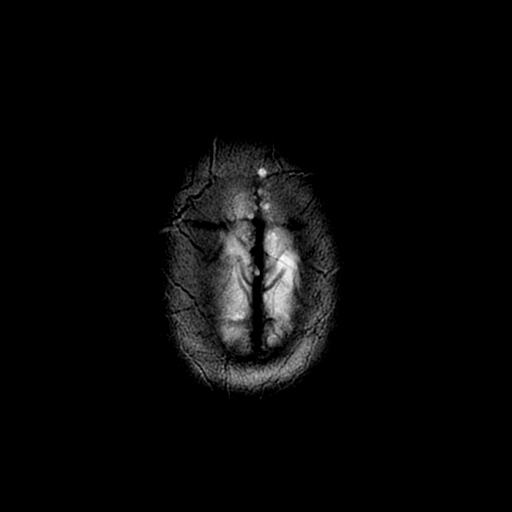

[Series 6: FLAIR · axial · 5.0mm · 0.47mm/px · z∈[-96,+52]mm · 2 of 26 slices shown (2 of 2)]
[im 1/26]
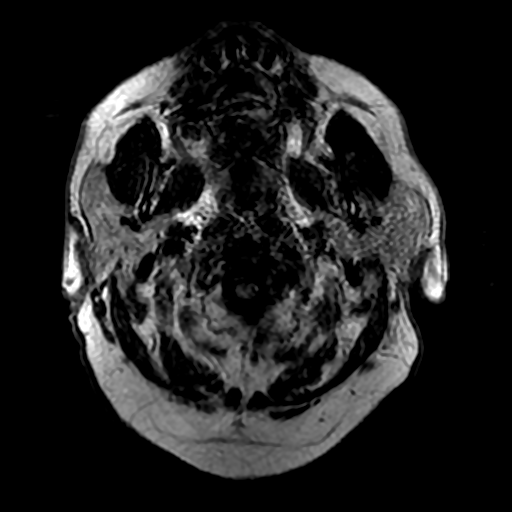
[im 26/26]
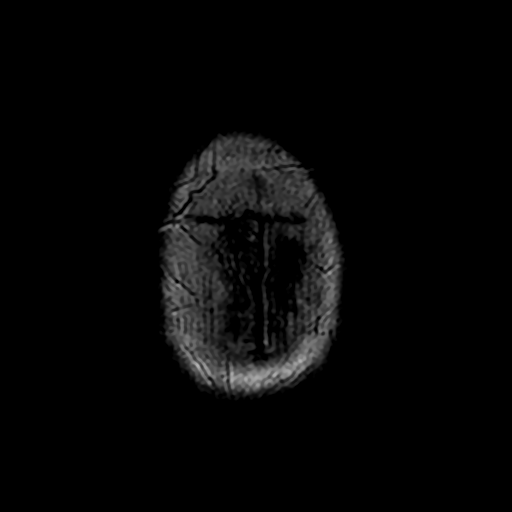

[Series 7: (person_name) · axial · 3.0mm · 0.47mm/px · z∈[-98,-81]mm · 2 of 104 slices shown]
[im 1/104]
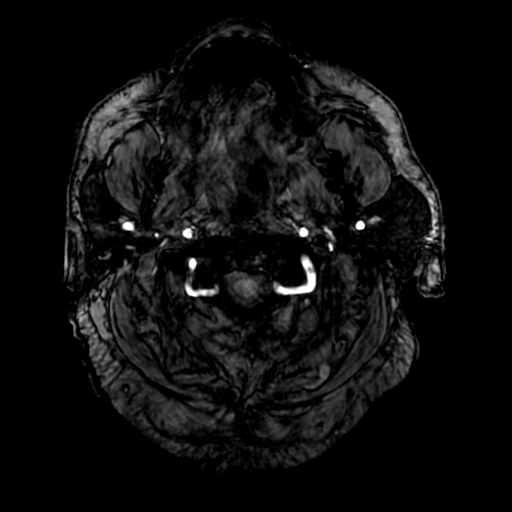
[im 12/104]
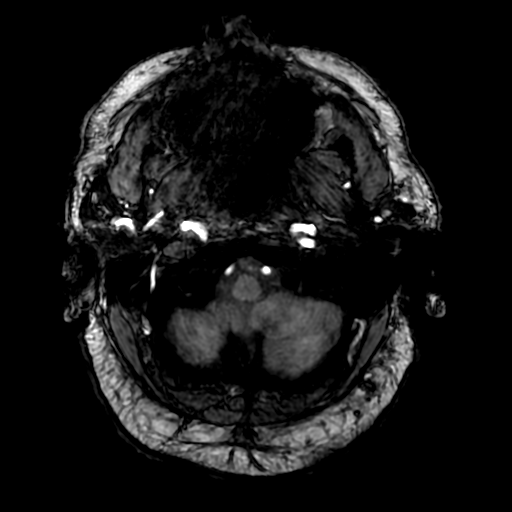

[Series 8: DWI · coronal · 4.0mm · 0.94mm/px · 7 of 72 slices shown (2 of 2)]
[im 1/72]
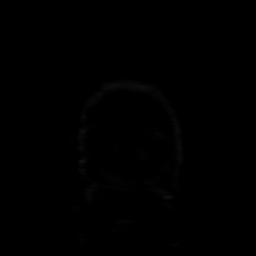
[im 12/72]
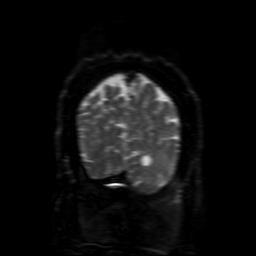
[im 24/72]
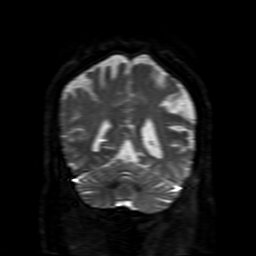
[im 36/72]
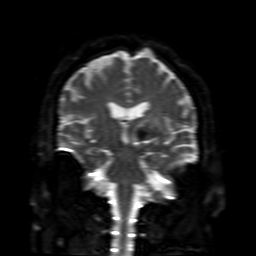
[im 48/72]
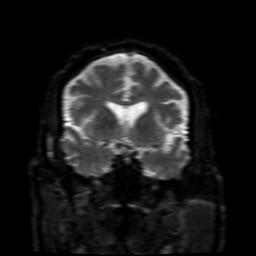
[im 60/72]
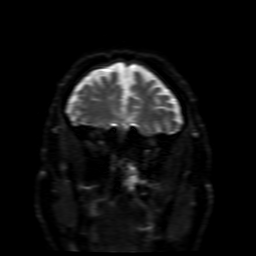
[im 72/72]
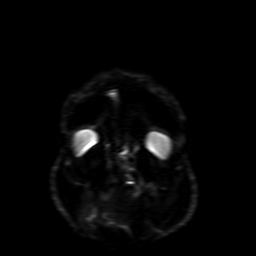

[Series 10: T2 · coronal · 5.0mm · 0.39mm/px · 3 of 30 slices shown (2 of 2)]
[im 1/30]
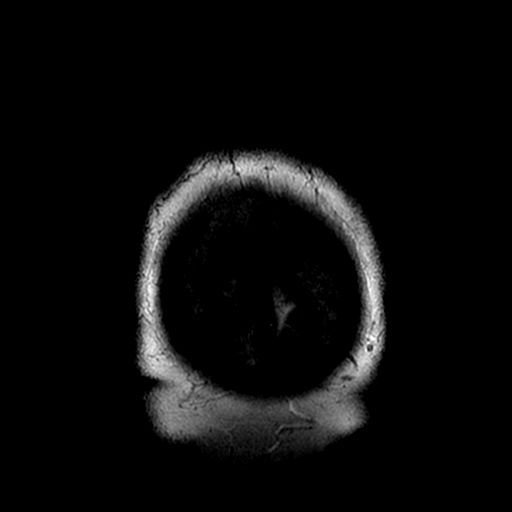
[im 15/30]
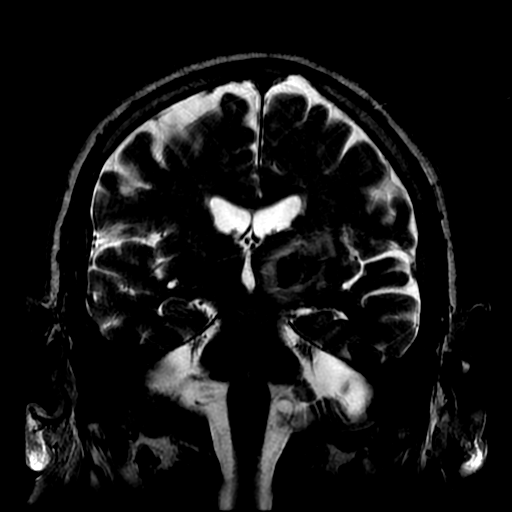
[im 30/30]
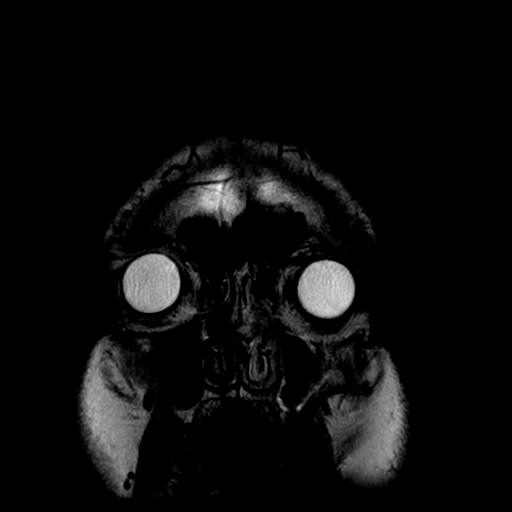

[Series 450: ADC · axial · 3.0mm · 0.94mm/px · z∈[-98,+53]mm · 5 of 52 slices shown (1 of 2)]
[im 1/52]
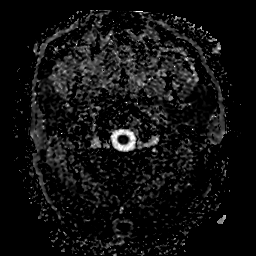
[im 13/52]
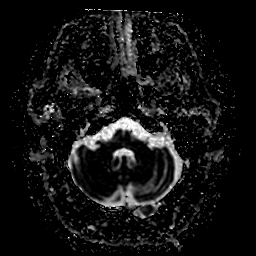
[im 26/52]
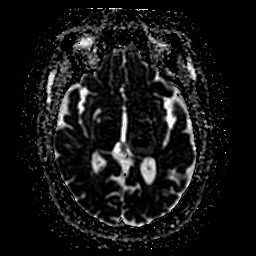
[im 39/52]
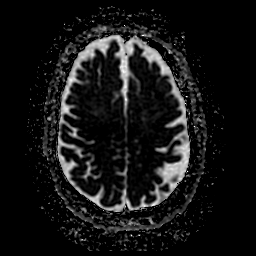
[im 52/52]
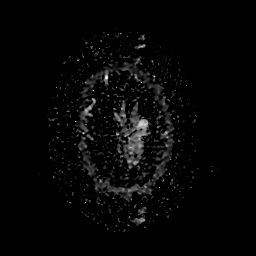

[Series 850: ADC · coronal · 4.0mm · 0.94mm/px · 3 of 36 slices shown (2 of 2)]
[im 1/36]
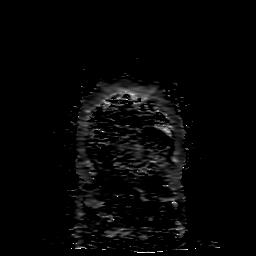
[im 18/36]
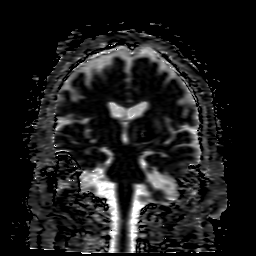
[im 36/36]
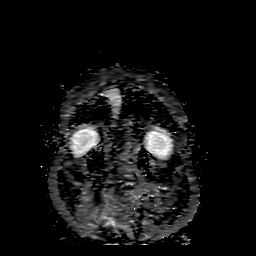

[35 of 48 positions shown; findings below may reference images not displayed]

FINDINGS: Brain: The hematoma centered within the left thalamus extending into
left posterior corona radiata is stable in comparison with prior CT
given differences in technique. Hematoma is largely T1 heterogeneous
and T2 hypointense compatible with acute/ early subacute blood
products. There is a small surrounding area of vasogenic edema and
local mass effect. There is peripheral diffusion hyperintensity of
the hematoma fat is probably due to susceptibility artifact from the
presence of blood products. There is no additional diffusion
abnormality to suggest a superimposed acute or early subacute
infarction.

Few foci of T2 FLAIR hyperintensity in subcortical periventricular
white matter compatible mild chronic microvascular ischemic changes.
Mild brain parenchymal volume loss. No extra-axial collection. No
herniation. No effacement of basilar cisterns. Normal ventricle size

Vascular: Normal flow voids.

Skull and upper cervical spine: Normal marrow signal.

Sinuses/Orbits: Mild maxillary sinus mucosal thickening. No abnormal
signal of mastoid air cells. Orbits are unremarkable.

Other: Negative.
IMPRESSION: 1. Acute/early subacute hemorrhage within the left thalamus
extending into left posterior corona radiata and small surrounding a
region of vasogenic edema and local mass effect. No additional
hemorrhage is identified.
2. No additional focus of diffusion signal abnormality to suggest a
superimposed infarct. The hemorrhages is likely hypertensive in
etiology given central distribution.
3. Background of mild chronic microvascular ischemic changes and
parenchymal volume loss.

By: Blade Aujla M.D.

## 2017-10-03 IMAGING — DX DG SHOULDER 2+V*R*
4 series · 4 of 4 positions shown · non-contrast
Comparison: Portable chest 11/14/2016

CLINICAL DATA: Chronic right shoulder pain. History of
osteoarthritis.

EXAM:
RIGHT SHOULDER - 2+ VIEW

[x shoulder ap right (1 of 4)]
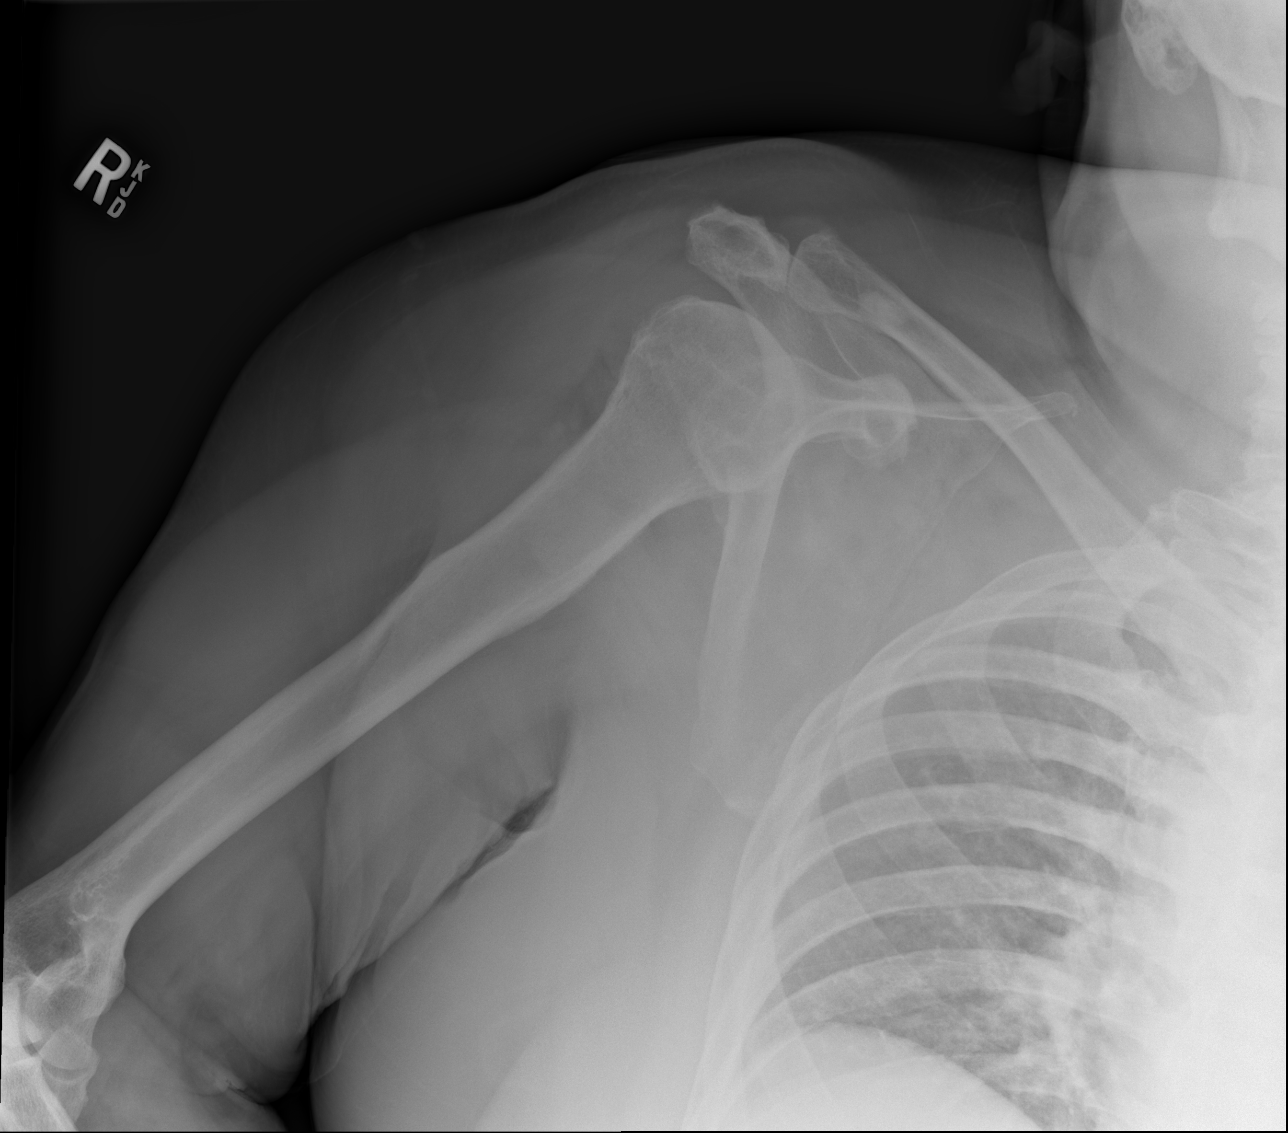

[x shoulder ap right (2 of 4)]
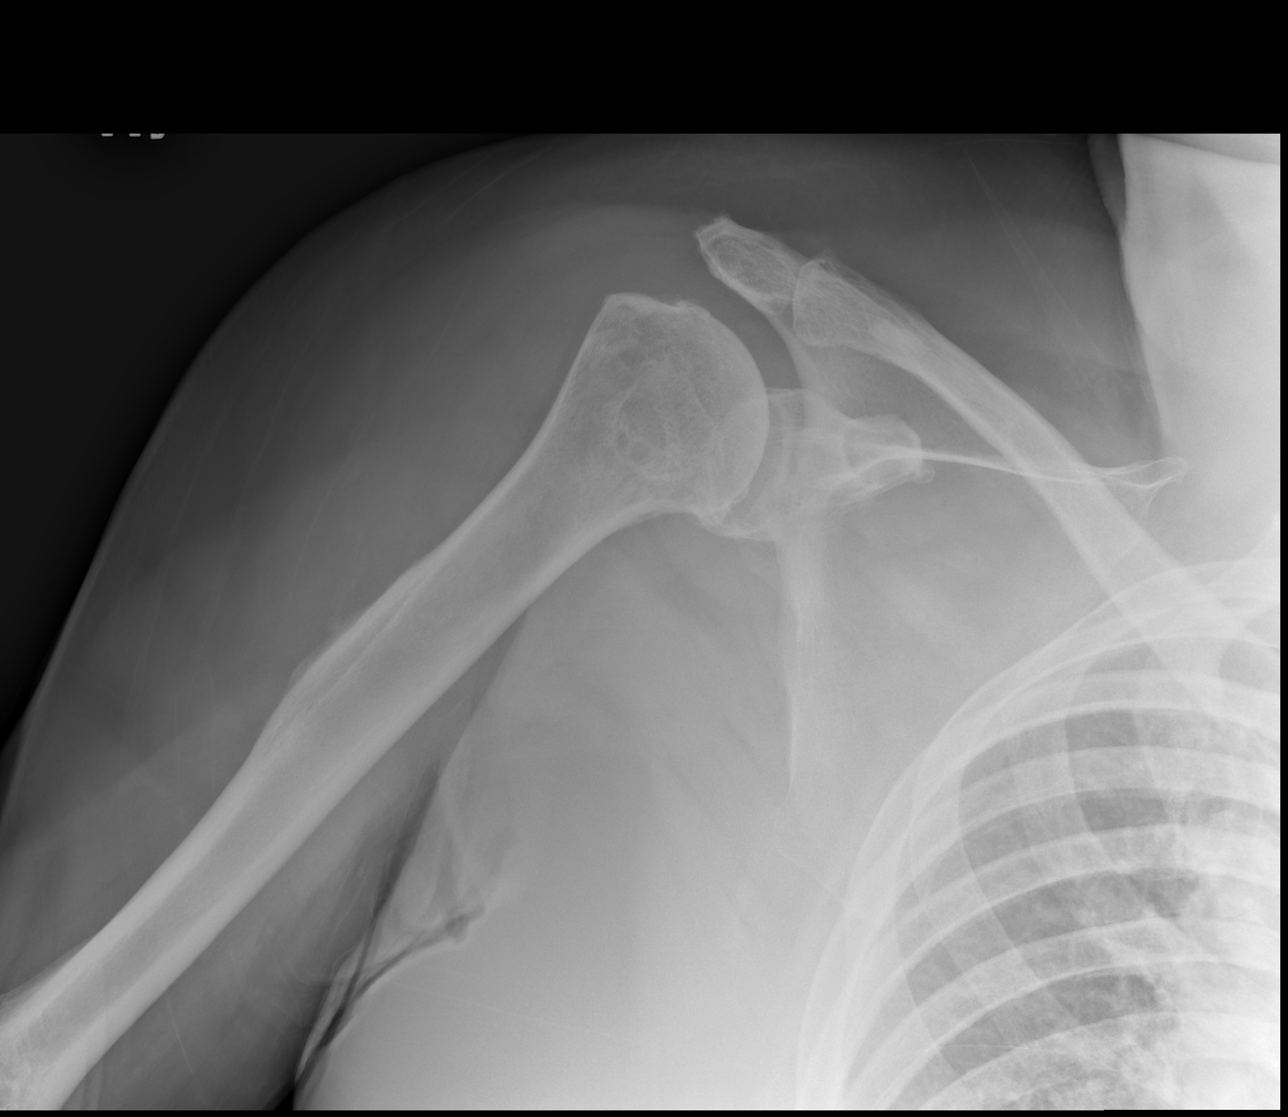

[x shoulder ap right (3 of 4)]
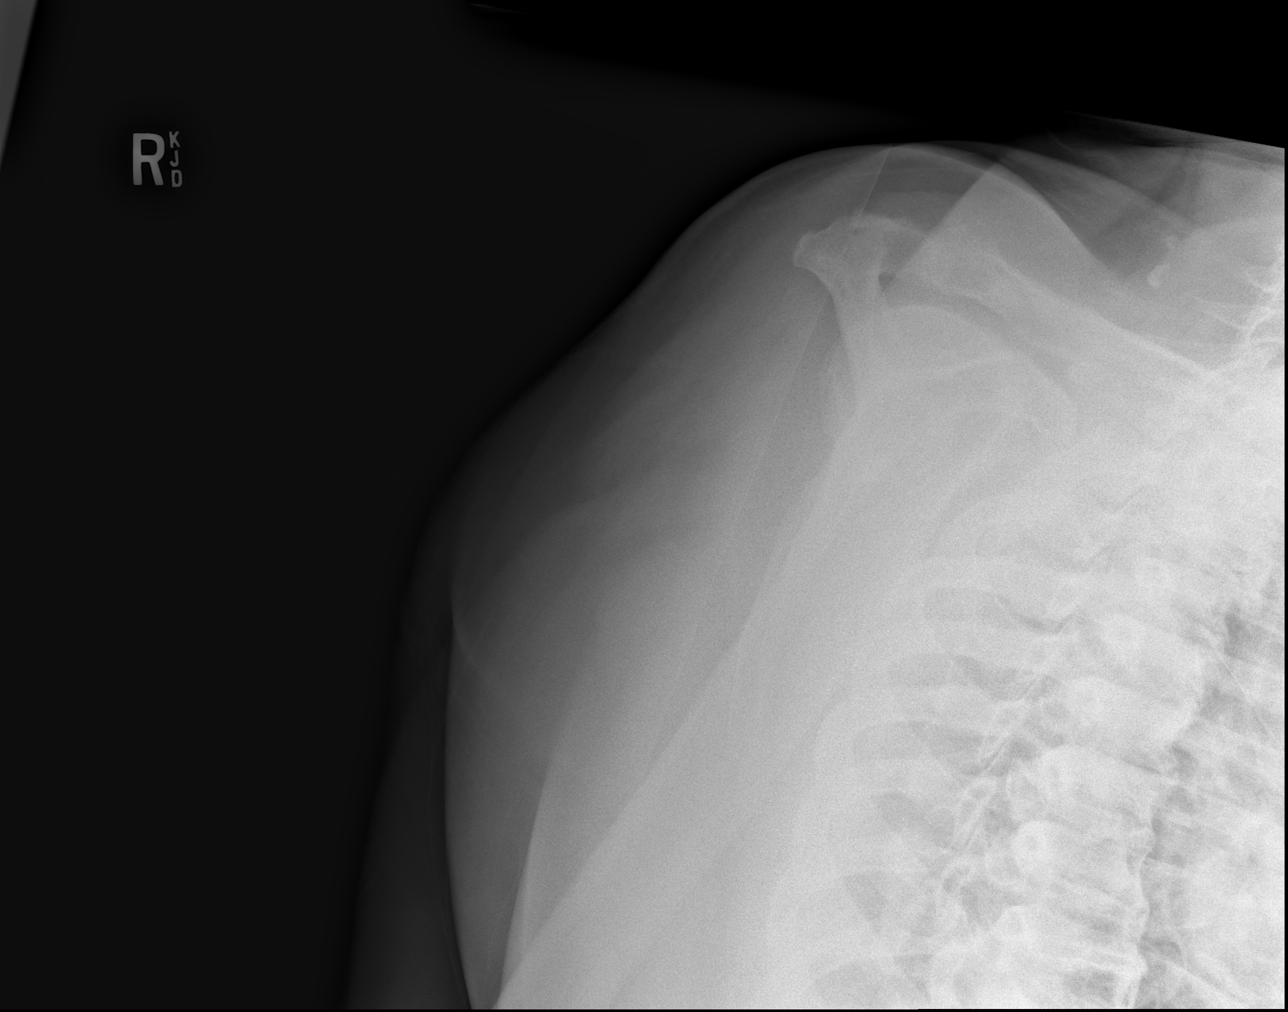

[x shoulder ap right (4 of 4)]
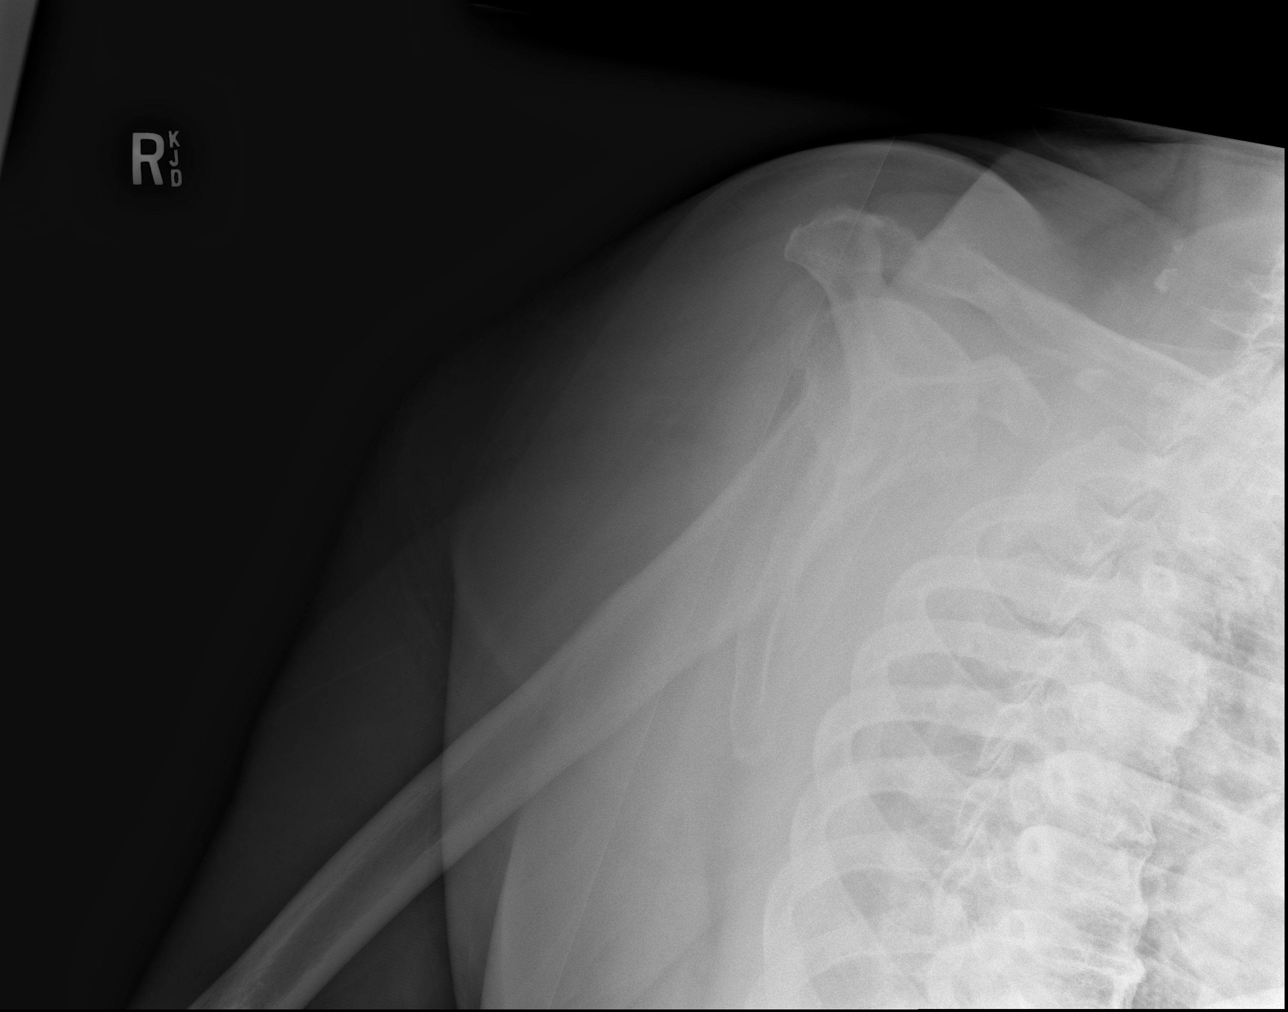

[4 of 4 positions shown; findings below may reference images not displayed]

FINDINGS: The mineralization and alignment are normal. There is no evidence of
acute fracture or dislocation. There are moderate glenohumeral and
mild acromioclavicular degenerative changes. The subacromial space
appears adequately preserved. Sclerotic lesion in the distal
clavicle noted, likely a bone island.
IMPRESSION: No acute findings demonstrated. Degenerative changes as described,
greatest in the glenohumeral joint.

## 2017-10-05 DIAGNOSIS — I6789 Other cerebrovascular disease: Secondary | ICD-10-CM | POA: Diagnosis not present

## 2017-10-05 DIAGNOSIS — I169 Hypertensive crisis, unspecified: Secondary | ICD-10-CM | POA: Diagnosis not present

## 2017-10-08 DIAGNOSIS — Z79899 Other long term (current) drug therapy: Secondary | ICD-10-CM | POA: Diagnosis not present

## 2017-10-08 DIAGNOSIS — K922 Gastrointestinal hemorrhage, unspecified: Secondary | ICD-10-CM | POA: Diagnosis not present

## 2017-10-08 DIAGNOSIS — Z7982 Long term (current) use of aspirin: Secondary | ICD-10-CM | POA: Diagnosis not present

## 2017-10-08 DIAGNOSIS — D62 Acute posthemorrhagic anemia: Secondary | ICD-10-CM | POA: Diagnosis not present

## 2017-10-08 DIAGNOSIS — R000 Tachycardia, unspecified: Secondary | ICD-10-CM | POA: Diagnosis not present

## 2017-10-08 DIAGNOSIS — M1990 Unspecified osteoarthritis, unspecified site: Secondary | ICD-10-CM | POA: Diagnosis not present

## 2017-10-08 DIAGNOSIS — I10 Essential (primary) hypertension: Secondary | ICD-10-CM | POA: Diagnosis not present

## 2017-10-08 DIAGNOSIS — D500 Iron deficiency anemia secondary to blood loss (chronic): Secondary | ICD-10-CM | POA: Diagnosis not present

## 2017-10-08 DIAGNOSIS — Z87891 Personal history of nicotine dependence: Secondary | ICD-10-CM | POA: Diagnosis not present

## 2017-10-08 DIAGNOSIS — Z8673 Personal history of transient ischemic attack (TIA), and cerebral infarction without residual deficits: Secondary | ICD-10-CM | POA: Diagnosis not present

## 2017-10-08 DIAGNOSIS — K5790 Diverticulosis of intestine, part unspecified, without perforation or abscess without bleeding: Secondary | ICD-10-CM | POA: Diagnosis not present

## 2017-10-10 DIAGNOSIS — K922 Gastrointestinal hemorrhage, unspecified: Secondary | ICD-10-CM | POA: Diagnosis not present

## 2017-10-10 DIAGNOSIS — D62 Acute posthemorrhagic anemia: Secondary | ICD-10-CM | POA: Diagnosis not present

## 2017-10-10 DIAGNOSIS — I10 Essential (primary) hypertension: Secondary | ICD-10-CM | POA: Diagnosis not present

## 2017-10-10 DIAGNOSIS — M1990 Unspecified osteoarthritis, unspecified site: Secondary | ICD-10-CM | POA: Diagnosis not present

## 2017-10-16 ENCOUNTER — Other Ambulatory Visit: Payer: Self-pay

## 2017-10-16 NOTE — Patient Outreach (Signed)
[facility] Care Management  10/16/2017  Justin FillerStephen [name] 09/06/1951 [id number]      Transition of Care Referral  Referral Date:  10/16/17 Referral Source: HTA Discharge Report Date of Admission: unknown Diagnosis: GI hemorrhage Date of Discharge: 10/10/17 Facility: [facility] Insurance: HTA    Outreach attempt # 1 to patient.  No answer at present.     Plan: RN CM will make outreach attempt to patient within one business day.   Antionette Fairyoshanda Marlowe Cinquemani, RN,BSN,CCM [facility] Care [facility] Telephonic Care Management Coordinator Direct Phone: [phone] Toll Free: [phone] Fax: [phone]

## 2017-10-17 ENCOUNTER — Other Ambulatory Visit: Payer: Self-pay

## 2017-10-17 NOTE — Patient Outreach (Signed)
[facility]) Care Management  10/17/2017  Gay FillerStephen Kuck 06/13/1951 [id number]      Transition of Care Referral  Referral Date:  10/16/17 Referral Source: HTA Discharge Report Date of Admission: unknown Diagnosis: GI hemorrhage Date of Discharge: 10/10/17 Facility: [facility] Insurance: HTA   Outreach attempt #2 to patient. Recording stated call can not be completed as dialed. No answer at number listed for spouse.    Plan: RN CM will make outreach attempt to patient within two business day.     Antionette Fairyoshanda Sigifredo Pignato, RN,BSN,CCM [facility] Care [facility] Telephonic Care Management Coordinator Direct Phone: [phone] Toll Free: [phone] Fax: [phone]

## 2017-10-18 ENCOUNTER — Institutional Professional Consult (permissible substitution): Payer: PPO | Admitting: Neurology

## 2017-10-18 ENCOUNTER — Other Ambulatory Visit: Payer: Self-pay

## 2017-10-18 NOTE — Patient Outreach (Signed)
[facility]) Care Management  10/18/2017  Gay FillerStephen Dory 03/23/1951 [id number]   Transition of Care Referral  Referral Date:10/16/17 Referral Source:HTA Discharge Report Date of Admission:unknown Diagnosis:GI hemorrhage Date of Discharge:10/10/17 Facility:[facility] Insurance:HTA   Outreach attempt #3 to patient. No answer at present. No alternate numbers to attempt.     Plan: RN CM will send unsuccessful outreach letter to patient and close case if no response within 10 business days.   Antionette Fairyoshanda Jermayne Sweeney, RN,BSN,CCM [facility] Care [facility] Telephonic Care Management Coordinator Direct Phone: [phone] Toll Free: [phone] Fax: [phone]

## 2017-11-01 ENCOUNTER — Other Ambulatory Visit: Payer: Self-pay

## 2017-11-01 NOTE — Telephone Encounter (Signed)
This encounter was created in error - please disregard.

## 2017-11-01 NOTE — Patient Outreach (Signed)
[facility]) Care Management  11/01/2017  Gay FillerStephen Etheredge 08/17/1951 [id number]     Transition of Care Referral  Referral Date:10/16/17 Referral Source:HTA Discharge Report Date of Admission:unknown Diagnosis:GI hemorrhage Date of Discharge:10/10/17 Facility:[facility] Insurance:HTA   Multiple attempts to establish contact with patient without success. No response from letter mailed to patient. Case is being closed at this time.     Plan: RN CM will notify [facility] administrative assistant of case status.   Antionette Fairyoshanda Claudetta Sallie, RN,BSN,CCM [facility] Care Management Telephonic Care Management Coordinator Direct Phone: [phone] Toll Free: [phone] Fax: [phone]

## 2017-11-04 DIAGNOSIS — I169 Hypertensive crisis, unspecified: Secondary | ICD-10-CM | POA: Diagnosis not present

## 2017-11-04 DIAGNOSIS — I6789 Other cerebrovascular disease: Secondary | ICD-10-CM | POA: Diagnosis not present

## 2017-12-05 DIAGNOSIS — I6789 Other cerebrovascular disease: Secondary | ICD-10-CM | POA: Diagnosis not present

## 2017-12-05 DIAGNOSIS — I169 Hypertensive crisis, unspecified: Secondary | ICD-10-CM | POA: Diagnosis not present

## 2018-02-04 NOTE — Progress Notes (Deleted)
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 Repeat MRI of the brain 4/ 9 /2018 shows sequela of a hemorrhagic stroke in the left lateral thalamus. When compared to  MRI of December 2017  expected evolution of the hemorrhage that was acute on that scan. Minimal age-appropriate chronic microvascular ischemic change. He still has some weakness in  the right hand.He continues to see Dr. Larna Daughters. All therapies have concluded Blood pressure in the office today 119/78. He is trying to be more active and exercise. Wife reports that he snores and he has been told in the past he needs a sleep study. He returns for reevaluation 01/03/17 [name] is a pleasant 67 year old Caucasian male seen today for consultation following admission to [facility] on 11/14/16 with intracerebral hemorrhage. History is obtained from patient and referral notes. I personally reviewed imaging studies at [facility] but hospital discharge summary is not available at this time. He presented on 11/14/16 with sudden onset of right hand weakness and slurred speech. He was taken to [facility] where his blood pressure was found to be significantly elevated and he was given IV blood pressure medications for tighter control. CT scan of the head showed a 2.2 x 1.4 mm acute left thalamic hemorrhage with minimum edema but no intraventricular extension or hydrocephalus. Telemetry neurology was consulted and patient was admitted to the intensive care unit and follow-up CT scan showed stable appearance of the hemorrhage. Transthoracic echo and carotid ultrasound were apparently unremarkable. Patient was seen by therapy and was transferred to inpatient  rehabilitation and [facility]. Patient's blood pressure monitored and kept under tight control. Patient is presently getting outpatient therapy. He states he still has some weakness and incoordination and sensory loss in his right hand mainly. His blood pressure is doing better now. Patient had previously not seen him primary medical doctor for 10 years and had uncontrolled hypertension which was undetected and untreated. Today his blood pressure is 103/64 in our office. He has stopped any added salt in his diet and has started to eat healthy and plans to lose weight. He has a repeat MRI scan of the brain scheduled on March 2.   REVIEW OF SYSTEMS: Full 14 system review of systems performed and notable only for those listed, all others are neg:  Constitutional: neg  Cardiovascular: neg Ear/Nose/Throat: neg  Skin: neg Eyes: neg Respiratory: neg Gastroitestinal: neg  Hematology/Lymphatic: neg  Endocrine: neg Musculoskeletal: Right hand weakness Allergy/Immunology: neg Neurological: neg Psychiatric: Anxiety Sleep : neg   ALLERGIES: No Known Allergies  HOME MEDICATIONS: Outpatient Medications Prior to Visit  Medication Sig Dispense Refill  . acetaminophen (TYLENOL) 325 MG tablet Take 650 mg by mouth every 6 (six) hours as needed.    Marland Kitchen aspirin EC 81 MG tablet Take 81 mg by mouth daily.    . carvedilol (COREG) 3.125 MG tablet Take 1 tablet (3.125 mg total) by mouth 2 (two) times daily with a meal. 60 tablet 0  . diclofenac sodium (VOLTAREN) 1 % GEL Apply 2 g topically 4 (four) times daily. 4 Tube 1  . hydrochlorothiazide (HYDRODIURIL) 25 MG tablet Take 1 tablet (25 mg total) by mouth daily. 30 tablet 0  . hydrOXYzine (ATARAX/VISTARIL) 50 MG tablet Take 50 mg by mouth 3 (three)  times daily as needed. 1/2 - one tablet every 8 hours prn for anxiety.    Marland Kitchen. losartan (COZAAR) 100 MG tablet Take 1 tablet (100 mg total) by mouth daily. 30 tablet 0   No facility-administered medications prior to  visit.     PAST MEDICAL HISTORY: Past Medical History:  Diagnosis Date  . HTN (hypertension)   . OA (osteoarthritis) of knee    endstage OA bilateral knees--was recently cleared for surgery  . Right leg swelling   . Right shoulder injury 2015  . Stroke [facility](H[facility])     PAST SURGICAL HISTORY: Past Surgical History:  Procedure Laterality Date  . TONSILLECTOMY      FAMILY HISTORY: Family History  Problem Relation Age of Onset  . Dementia Mother   . Diabetes Father     SOCIAL HISTORY: Social History   Socioeconomic History  . Marital status: Married    Spouse name: Not on file  . Number of children: Not on file  . Years of education: Not on file  . Highest education level: Not on file  Social Needs  . Financial resource strain: Not on file  . Food insecurity - worry: Not on file  . Food insecurity - inability: Not on file  . Transportation needs - medical: Not on file  . Transportation needs - non-medical: Not on file  Occupational History  . Not on file  Tobacco Use  . Smoking status: Never Smoker  . Smokeless tobacco: Never Used  Substance and Sexual Activity  . Alcohol use: No    Comment: History of alcohol abuse--none for 8 years  . Drug use: No  . Sexual activity: Not on file  Other Topics Concern  . Not on file  Social History Narrative  . Not on file     PHYSICAL EXAM  There were no vitals filed for this visit. There is no height or weight on file to calculate BMI.  Generalized: Well developed, in no acute distress  Head: normocephalic and atraumatic,. Oropharynx benign  Neck: Supple, no carotid bruits  Cardiac: Regular rate rhythm, no murmur  Musculoskeletal: No deformity   Neurological examination   Mentation: Alert oriented to time, place, history taking. Attention span and concentration appropriate. Recent and remote memory intact.  Follows all commands speech and language fluent.   Cranial nerve II-XII: Pupils were equal round reactive to  light extraocular movements were full, visual field were full on confrontational test. Facial sensation and strength were normal. hearing was intact to finger rubbing bilaterally. Uvula tongue midline. head turning and shoulder shrug were normal and symmetric.Tongue protrusion into cheek strength was normal. Motor: Mild weakness of the right grip and intrinsic hand muscles with diminished fine finger movements on the right. Sensory: Diminished touch and pinprick and position and vibratory sensation on the right side normal on the left   Coordination: finger-nose-finger, heel-to-shin bilaterally, no dysmetria Reflexes: 1+ upper lower and symmetric plantar responses were flexor bilaterally. Gait and Station: Rising up from seated position without assistance, normal stance,  moderate stride, good arm swing, smooth turning, able to perform tiptoe, and heel walking without difficulty. Tandem gait is unsteady  DIAGNOSTIC DATA (LABS, IMAGING, TESTING) - I reviewed patient records, labs, notes, testing and imaging myself where available.  Lab Results  Component Value Date   WBC 6.7 12/05/2016   HGB 14.2 12/05/2016   HCT 42.5 12/05/2016   MCV 91.2 12/05/2016   PLT 265 12/05/2016      Component Value Date/Time  NA 132 (L) 12/05/2016 1102   K 3.5 12/05/2016 1102   CL 98 (L) 12/05/2016 1102   CO2 26 12/05/2016 1102   GLUCOSE 90 12/05/2016 1102   BUN 15 12/05/2016 1102   CREATININE 0.87 12/05/2016 1102   CALCIUM 9.0 12/05/2016 1102   PROT 6.5 11/18/2016 0412   ALBUMIN 3.4 (L) 11/18/2016 0412   AST 25 11/18/2016 0412   ALT 34 11/18/2016 0412   ALKPHOS 44 11/18/2016 0412   BILITOT 0.8 11/18/2016 0412   GFRNONAA >60 12/05/2016 1102   GFRAA >60 12/05/2016 1102    ASSESSMENT AND PLAN   2 year Caucasian male with left thalamic intracerebral hemorrhage secondary to hypertension in December 2017 with persistent mild right hand sensory and proprioceptive impairment.    PLAN: Stressed the  importance of management of risk factors to prevent further stroke MRI of the brain 03/12/17 with expected evolution of the hemorrhage that was acute on that scan.    There are no new strokes.Begin aspirin .81mg  Maintain strict control of hypertension with blood pressure goal below 130/90, today's reading 119/78 continue antihypertensive medications Control of diabetes with hemoglobin A1c below 6.5 followed by primary care Cholesterol with LDL cholesterol less than 70, followed by primary care,  Exercise by walking, slowly increase , eat healthy diet with whole grains,  fresh fruits and vegetables Will set up for sleep study Follow up in 6 months I spent 25 min  in total face to face time with the patient more than 50% of which was spent counseling and coordination of care, reviewing test results reviewing medications and discussing and reviewing the diagnosis of stroke and management of risk factors. Also discussed importance of sleep study given patient's medical history Nilda Riggs, [facility], [facility], APRN  [facility] [address] [place], [place] [address] [phone]

## 2018-02-05 ENCOUNTER — Ambulatory Visit: Payer: PPO | Admitting: Nurse Practitioner

## 2018-05-29 DIAGNOSIS — Z1159 Encounter for screening for other viral diseases: Secondary | ICD-10-CM | POA: Diagnosis not present

## 2018-05-29 DIAGNOSIS — Z23 Encounter for immunization: Secondary | ICD-10-CM | POA: Diagnosis not present

## 2018-05-29 DIAGNOSIS — Z79899 Other long term (current) drug therapy: Secondary | ICD-10-CM | POA: Diagnosis not present

## 2018-05-29 DIAGNOSIS — M170 Bilateral primary osteoarthritis of knee: Secondary | ICD-10-CM | POA: Diagnosis not present

## 2018-05-29 DIAGNOSIS — I10 Essential (primary) hypertension: Secondary | ICD-10-CM | POA: Diagnosis not present

## 2018-05-29 DIAGNOSIS — Z125 Encounter for screening for malignant neoplasm of prostate: Secondary | ICD-10-CM | POA: Diagnosis not present

## 2018-05-29 DIAGNOSIS — I6930 Unspecified sequelae of cerebral infarction: Secondary | ICD-10-CM | POA: Diagnosis not present

## 2018-05-29 DIAGNOSIS — E785 Hyperlipidemia, unspecified: Secondary | ICD-10-CM | POA: Diagnosis not present

## 2018-05-29 DIAGNOSIS — R739 Hyperglycemia, unspecified: Secondary | ICD-10-CM | POA: Diagnosis not present

## 2018-05-29 DIAGNOSIS — Z0000 Encounter for general adult medical examination without abnormal findings: Secondary | ICD-10-CM | POA: Diagnosis not present

## 2018-05-29 DIAGNOSIS — I69359 Hemiplegia and hemiparesis following cerebral infarction affecting unspecified side: Secondary | ICD-10-CM | POA: Diagnosis not present

## 2019-01-21 DIAGNOSIS — I6930 Unspecified sequelae of cerebral infarction: Secondary | ICD-10-CM | POA: Diagnosis not present

## 2019-01-21 DIAGNOSIS — I679 Cerebrovascular disease, unspecified: Secondary | ICD-10-CM | POA: Diagnosis not present

## 2019-01-21 DIAGNOSIS — I10 Essential (primary) hypertension: Secondary | ICD-10-CM | POA: Diagnosis not present

## 2019-01-21 DIAGNOSIS — Z6837 Body mass index (BMI) 37.0-37.9, adult: Secondary | ICD-10-CM | POA: Diagnosis not present

## 2019-01-21 DIAGNOSIS — M170 Bilateral primary osteoarthritis of knee: Secondary | ICD-10-CM | POA: Diagnosis not present

## 2019-01-21 DIAGNOSIS — E785 Hyperlipidemia, unspecified: Secondary | ICD-10-CM | POA: Diagnosis not present

## 2019-01-21 DIAGNOSIS — Z23 Encounter for immunization: Secondary | ICD-10-CM | POA: Diagnosis not present

## 2019-01-21 DIAGNOSIS — R29818 Other symptoms and signs involving the nervous system: Secondary | ICD-10-CM | POA: Diagnosis not present

## 2019-01-21 DIAGNOSIS — R739 Hyperglycemia, unspecified: Secondary | ICD-10-CM | POA: Diagnosis not present

## 2019-01-21 DIAGNOSIS — I69359 Hemiplegia and hemiparesis following cerebral infarction affecting unspecified side: Secondary | ICD-10-CM | POA: Diagnosis not present

## 2019-06-30 DIAGNOSIS — Z20828 Contact with and (suspected) exposure to other viral communicable diseases: Secondary | ICD-10-CM | POA: Diagnosis not present

## 2019-07-03 ENCOUNTER — Other Ambulatory Visit: Payer: Self-pay

## 2019-10-09 DIAGNOSIS — I6930 Unspecified sequelae of cerebral infarction: Secondary | ICD-10-CM | POA: Diagnosis not present

## 2019-10-09 DIAGNOSIS — E559 Vitamin D deficiency, unspecified: Secondary | ICD-10-CM | POA: Diagnosis not present

## 2019-10-09 DIAGNOSIS — Z79899 Other long term (current) drug therapy: Secondary | ICD-10-CM | POA: Diagnosis not present

## 2019-10-09 DIAGNOSIS — Z23 Encounter for immunization: Secondary | ICD-10-CM | POA: Diagnosis not present

## 2019-10-09 DIAGNOSIS — I69359 Hemiplegia and hemiparesis following cerebral infarction affecting unspecified side: Secondary | ICD-10-CM | POA: Diagnosis not present

## 2019-10-09 DIAGNOSIS — E785 Hyperlipidemia, unspecified: Secondary | ICD-10-CM | POA: Diagnosis not present

## 2019-10-09 DIAGNOSIS — R739 Hyperglycemia, unspecified: Secondary | ICD-10-CM | POA: Diagnosis not present

## 2019-10-09 DIAGNOSIS — Z0000 Encounter for general adult medical examination without abnormal findings: Secondary | ICD-10-CM | POA: Diagnosis not present

## 2019-10-09 DIAGNOSIS — Z125 Encounter for screening for malignant neoplasm of prostate: Secondary | ICD-10-CM | POA: Diagnosis not present

## 2019-10-09 DIAGNOSIS — I679 Cerebrovascular disease, unspecified: Secondary | ICD-10-CM | POA: Diagnosis not present

## 2019-10-09 DIAGNOSIS — I10 Essential (primary) hypertension: Secondary | ICD-10-CM | POA: Diagnosis not present

## 2019-10-09 DIAGNOSIS — M170 Bilateral primary osteoarthritis of knee: Secondary | ICD-10-CM | POA: Diagnosis not present

## 2020-01-29 DIAGNOSIS — R509 Fever, unspecified: Secondary | ICD-10-CM | POA: Diagnosis not present

## 2020-01-29 DIAGNOSIS — Z20828 Contact with and (suspected) exposure to other viral communicable diseases: Secondary | ICD-10-CM | POA: Diagnosis not present

## 2020-01-29 DIAGNOSIS — R5381 Other malaise: Secondary | ICD-10-CM | POA: Diagnosis not present

## 2020-01-29 DIAGNOSIS — R5383 Other fatigue: Secondary | ICD-10-CM | POA: Diagnosis not present

## 2020-01-29 DIAGNOSIS — R05 Cough: Secondary | ICD-10-CM | POA: Diagnosis not present

## 2020-06-29 ENCOUNTER — Other Ambulatory Visit: Payer: Self-pay

## 2020-11-18 DIAGNOSIS — Z789 Other specified health status: Secondary | ICD-10-CM | POA: Diagnosis not present

## 2020-11-18 DIAGNOSIS — I10 Essential (primary) hypertension: Secondary | ICD-10-CM | POA: Diagnosis not present

## 2020-11-18 DIAGNOSIS — Z23 Encounter for immunization: Secondary | ICD-10-CM | POA: Diagnosis not present

## 2020-11-18 DIAGNOSIS — G720 Drug-induced myopathy: Secondary | ICD-10-CM | POA: Diagnosis not present

## 2020-11-18 DIAGNOSIS — I672 Cerebral atherosclerosis: Secondary | ICD-10-CM | POA: Diagnosis not present

## 2020-11-18 DIAGNOSIS — R7302 Impaired glucose tolerance (oral): Secondary | ICD-10-CM | POA: Diagnosis not present

## 2020-11-18 DIAGNOSIS — M1991 Primary osteoarthritis, unspecified site: Secondary | ICD-10-CM | POA: Diagnosis not present

## 2020-11-18 DIAGNOSIS — E785 Hyperlipidemia, unspecified: Secondary | ICD-10-CM | POA: Diagnosis not present

## 2020-11-18 DIAGNOSIS — I69359 Hemiplegia and hemiparesis following cerebral infarction affecting unspecified side: Secondary | ICD-10-CM | POA: Diagnosis not present

## 2020-11-18 DIAGNOSIS — Z0000 Encounter for general adult medical examination without abnormal findings: Secondary | ICD-10-CM | POA: Diagnosis not present

## 2020-11-18 DIAGNOSIS — I6930 Unspecified sequelae of cerebral infarction: Secondary | ICD-10-CM | POA: Diagnosis not present

## 2021-05-04 DIAGNOSIS — I6930 Unspecified sequelae of cerebral infarction: Secondary | ICD-10-CM | POA: Diagnosis not present

## 2021-05-04 DIAGNOSIS — Z6841 Body Mass Index (BMI) 40.0 and over, adult: Secondary | ICD-10-CM | POA: Diagnosis not present

## 2021-05-04 DIAGNOSIS — I10 Essential (primary) hypertension: Secondary | ICD-10-CM | POA: Diagnosis not present

## 2021-05-04 DIAGNOSIS — R221 Localized swelling, mass and lump, neck: Secondary | ICD-10-CM | POA: Diagnosis not present

## 2021-05-04 DIAGNOSIS — I69359 Hemiplegia and hemiparesis following cerebral infarction affecting unspecified side: Secondary | ICD-10-CM | POA: Diagnosis not present

## 2021-05-13 DIAGNOSIS — R221 Localized swelling, mass and lump, neck: Secondary | ICD-10-CM | POA: Diagnosis not present

## 2021-05-19 DIAGNOSIS — K111 Hypertrophy of salivary gland: Secondary | ICD-10-CM | POA: Diagnosis not present

## 2021-05-19 DIAGNOSIS — R221 Localized swelling, mass and lump, neck: Secondary | ICD-10-CM | POA: Diagnosis not present

## 2021-05-19 DIAGNOSIS — R590 Localized enlarged lymph nodes: Secondary | ICD-10-CM | POA: Diagnosis not present

## 2022-03-07 DIAGNOSIS — M1991 Primary osteoarthritis, unspecified site: Secondary | ICD-10-CM | POA: Diagnosis not present

## 2022-03-07 DIAGNOSIS — I10 Essential (primary) hypertension: Secondary | ICD-10-CM | POA: Diagnosis not present

## 2022-03-07 DIAGNOSIS — Z125 Encounter for screening for malignant neoplasm of prostate: Secondary | ICD-10-CM | POA: Diagnosis not present

## 2022-03-07 DIAGNOSIS — I672 Cerebral atherosclerosis: Secondary | ICD-10-CM | POA: Diagnosis not present

## 2022-03-07 DIAGNOSIS — Z0000 Encounter for general adult medical examination without abnormal findings: Secondary | ICD-10-CM | POA: Diagnosis not present

## 2022-03-07 DIAGNOSIS — R7302 Impaired glucose tolerance (oral): Secondary | ICD-10-CM | POA: Diagnosis not present

## 2022-03-07 DIAGNOSIS — Z6837 Body mass index (BMI) 37.0-37.9, adult: Secondary | ICD-10-CM | POA: Diagnosis not present

## 2022-03-07 DIAGNOSIS — I69359 Hemiplegia and hemiparesis following cerebral infarction affecting unspecified side: Secondary | ICD-10-CM | POA: Diagnosis not present

## 2022-03-07 DIAGNOSIS — G720 Drug-induced myopathy: Secondary | ICD-10-CM | POA: Diagnosis not present

## 2022-03-07 DIAGNOSIS — E785 Hyperlipidemia, unspecified: Secondary | ICD-10-CM | POA: Diagnosis not present

## 2022-03-07 DIAGNOSIS — I6930 Unspecified sequelae of cerebral infarction: Secondary | ICD-10-CM | POA: Diagnosis not present

## 2023-02-20 DIAGNOSIS — I69359 Hemiplegia and hemiparesis following cerebral infarction affecting unspecified side: Secondary | ICD-10-CM | POA: Diagnosis not present

## 2023-02-20 DIAGNOSIS — S0993XA Unspecified injury of face, initial encounter: Secondary | ICD-10-CM | POA: Diagnosis not present

## 2023-02-20 DIAGNOSIS — M12812 Other specific arthropathies, not elsewhere classified, left shoulder: Secondary | ICD-10-CM | POA: Diagnosis not present

## 2023-02-20 DIAGNOSIS — I10 Essential (primary) hypertension: Secondary | ICD-10-CM | POA: Diagnosis not present

## 2023-02-20 DIAGNOSIS — S20212A Contusion of left front wall of thorax, initial encounter: Secondary | ICD-10-CM | POA: Diagnosis not present

## 2023-02-20 DIAGNOSIS — M170 Bilateral primary osteoarthritis of knee: Secondary | ICD-10-CM | POA: Diagnosis not present

## 2023-02-20 DIAGNOSIS — M1991 Primary osteoarthritis, unspecified site: Secondary | ICD-10-CM | POA: Diagnosis not present

## 2023-02-20 DIAGNOSIS — I6930 Unspecified sequelae of cerebral infarction: Secondary | ICD-10-CM | POA: Diagnosis not present

## 2023-03-19 DIAGNOSIS — R2681 Unsteadiness on feet: Secondary | ICD-10-CM | POA: Diagnosis not present

## 2023-03-19 DIAGNOSIS — I69359 Hemiplegia and hemiparesis following cerebral infarction affecting unspecified side: Secondary | ICD-10-CM | POA: Diagnosis not present

## 2023-03-19 DIAGNOSIS — M12812 Other specific arthropathies, not elsewhere classified, left shoulder: Secondary | ICD-10-CM | POA: Diagnosis not present

## 2023-04-06 DIAGNOSIS — I69359 Hemiplegia and hemiparesis following cerebral infarction affecting unspecified side: Secondary | ICD-10-CM | POA: Diagnosis not present

## 2023-04-06 DIAGNOSIS — M1991 Primary osteoarthritis, unspecified site: Secondary | ICD-10-CM | POA: Diagnosis not present

## 2023-04-06 DIAGNOSIS — M6281 Muscle weakness (generalized): Secondary | ICD-10-CM | POA: Diagnosis not present

## 2023-04-06 DIAGNOSIS — R2681 Unsteadiness on feet: Secondary | ICD-10-CM | POA: Diagnosis not present

## 2023-04-10 DIAGNOSIS — M1991 Primary osteoarthritis, unspecified site: Secondary | ICD-10-CM | POA: Diagnosis not present

## 2023-04-10 DIAGNOSIS — E785 Hyperlipidemia, unspecified: Secondary | ICD-10-CM | POA: Diagnosis not present

## 2023-04-10 DIAGNOSIS — I672 Cerebral atherosclerosis: Secondary | ICD-10-CM | POA: Diagnosis not present

## 2023-04-10 DIAGNOSIS — R7302 Impaired glucose tolerance (oral): Secondary | ICD-10-CM | POA: Diagnosis not present

## 2023-04-10 DIAGNOSIS — Z79899 Other long term (current) drug therapy: Secondary | ICD-10-CM | POA: Diagnosis not present

## 2023-04-10 DIAGNOSIS — Z0000 Encounter for general adult medical examination without abnormal findings: Secondary | ICD-10-CM | POA: Diagnosis not present

## 2023-04-10 DIAGNOSIS — I6930 Unspecified sequelae of cerebral infarction: Secondary | ICD-10-CM | POA: Diagnosis not present

## 2023-04-10 DIAGNOSIS — G720 Drug-induced myopathy: Secondary | ICD-10-CM | POA: Diagnosis not present

## 2023-04-10 DIAGNOSIS — I69359 Hemiplegia and hemiparesis following cerebral infarction affecting unspecified side: Secondary | ICD-10-CM | POA: Diagnosis not present

## 2023-04-10 DIAGNOSIS — Z125 Encounter for screening for malignant neoplasm of prostate: Secondary | ICD-10-CM | POA: Diagnosis not present

## 2023-04-10 DIAGNOSIS — I10 Essential (primary) hypertension: Secondary | ICD-10-CM | POA: Diagnosis not present

## 2023-05-08 DIAGNOSIS — M1991 Primary osteoarthritis, unspecified site: Secondary | ICD-10-CM | POA: Diagnosis not present

## 2023-05-08 DIAGNOSIS — R2681 Unsteadiness on feet: Secondary | ICD-10-CM | POA: Diagnosis not present

## 2023-05-08 DIAGNOSIS — I69359 Hemiplegia and hemiparesis following cerebral infarction affecting unspecified side: Secondary | ICD-10-CM | POA: Diagnosis not present
